# Patient Record
Sex: Female | Born: 1970 | Hispanic: No | Smoking: Never smoker
Health system: Southern US, Community
[De-identification: ages and names within clinical notes are randomized; demographics above are authoritative.]

## PROBLEM LIST (undated history)

## (undated) DIAGNOSIS — M329 Systemic lupus erythematosus, unspecified: Secondary | ICD-10-CM

## (undated) HISTORY — DX: Systemic lupus erythematosus, unspecified: M32.9

---

## 2005-12-22 DIAGNOSIS — IMO0002 Reserved for concepts with insufficient information to code with codable children: Secondary | ICD-10-CM

## 2005-12-22 DIAGNOSIS — M329 Systemic lupus erythematosus, unspecified: Secondary | ICD-10-CM

## 2005-12-22 HISTORY — DX: Systemic lupus erythematosus, unspecified: M32.9

## 2005-12-22 HISTORY — DX: Reserved for concepts with insufficient information to code with codable children: IMO0002

## 2017-01-21 DIAGNOSIS — H524 Presbyopia: Secondary | ICD-10-CM | POA: Diagnosis not present

## 2017-10-09 DIAGNOSIS — L648 Other androgenic alopecia: Secondary | ICD-10-CM | POA: Diagnosis not present

## 2017-10-29 ENCOUNTER — Encounter: Payer: Self-pay | Admitting: Internal Medicine

## 2017-10-29 ENCOUNTER — Other Ambulatory Visit
Admission: RE | Admit: 2017-10-29 | Discharge: 2017-10-29 | Disposition: A | Discharge status: Home / Self Care | Payer: 59 | Source: Ambulatory Visit | Attending: Internal Medicine | Admitting: Internal Medicine

## 2017-10-29 ENCOUNTER — Ambulatory Visit (INDEPENDENT_AMBULATORY_CARE_PROVIDER_SITE_OTHER): Payer: 59 | Admitting: Internal Medicine

## 2017-10-29 VITALS — BP 110/72 | HR 71 | Ht 61.5 in | Wt 128.0 lb

## 2017-10-29 DIAGNOSIS — Z0000 Encounter for general adult medical examination without abnormal findings: Secondary | ICD-10-CM

## 2017-10-29 DIAGNOSIS — L930 Discoid lupus erythematosus: Secondary | ICD-10-CM | POA: Insufficient documentation

## 2017-10-29 DIAGNOSIS — Z114 Encounter for screening for human immunodeficiency virus [HIV]: Secondary | ICD-10-CM

## 2017-10-29 DIAGNOSIS — R87613 High grade squamous intraepithelial lesion on cytologic smear of cervix (HGSIL): Secondary | ICD-10-CM | POA: Insufficient documentation

## 2017-10-29 LAB — CBC WITH DIFFERENTIAL/PLATELET
Basophils Absolute: 0 10*3/uL (ref 0–0.1)
Basophils Relative: 1 %
EOS ABS: 0 10*3/uL (ref 0–0.7)
Eosinophils Relative: 1 %
HCT: 36.6 % (ref 35.0–47.0)
HEMOGLOBIN: 12.3 g/dL (ref 12.0–16.0)
LYMPHS ABS: 1.8 10*3/uL (ref 1.0–3.6)
LYMPHS PCT: 47 %
MCH: 33.7 pg (ref 26.0–34.0)
MCHC: 33.7 g/dL (ref 32.0–36.0)
MCV: 100.1 fL — AB (ref 80.0–100.0)
Monocytes Absolute: 0.4 10*3/uL (ref 0.2–0.9)
Monocytes Relative: 10 %
NEUTROS PCT: 43 %
Neutro Abs: 1.6 10*3/uL (ref 1.4–6.5)
Platelets: 231 10*3/uL (ref 150–440)
RBC: 3.65 MIL/uL — AB (ref 3.80–5.20)
RDW: 12.5 % (ref 11.5–14.5)
WBC: 3.8 10*3/uL (ref 3.6–11.0)

## 2017-10-29 LAB — TSH: TSH: 1.49 u[IU]/mL (ref 0.350–4.500)

## 2017-10-29 LAB — LIPID PANEL
Cholesterol: 169 mg/dL (ref 0–200)
HDL: 70 mg/dL (ref >40)
LDL CALC: 83 mg/dL (ref 0–99)
TRIGLYCERIDES: 82 mg/dL (ref <150)
Total CHOL/HDL Ratio: 2.4 RATIO
VLDL: 16 mg/dL (ref 0–40)

## 2017-10-29 LAB — COMPREHENSIVE METABOLIC PANEL
ALK PHOS: 53 U/L (ref 38–126)
ALT: 21 U/L (ref 14–54)
AST: 27 U/L (ref 15–41)
Albumin: 4.3 g/dL (ref 3.5–5.0)
Anion gap: 8 (ref 5–15)
BUN: 11 mg/dL (ref 6–20)
CALCIUM: 9.1 mg/dL (ref 8.9–10.3)
CO2: 24 mmol/L (ref 22–32)
CREATININE: 0.51 mg/dL (ref 0.44–1.00)
Chloride: 103 mmol/L (ref 101–111)
GFR calc non Af Amer: >60 mL/min (ref >60)
Glucose, Bld: 91 mg/dL (ref 65–99)
Potassium: 4.1 mmol/L (ref 3.5–5.1)
SODIUM: 135 mmol/L (ref 135–145)
Total Bilirubin: 0.7 mg/dL (ref 0.3–1.2)
Total Protein: 8.4 g/dL — ABNORMAL HIGH (ref 6.5–8.1)

## 2017-10-29 LAB — HEMOGLOBIN A1C
HEMOGLOBIN A1C: 5.5 % (ref 4.8–5.6)
MEAN PLASMA GLUCOSE: 111.15 mg/dL

## 2017-10-29 NOTE — Progress Notes (Signed)
Date:  10/29/2017   Name:  Lauren Sampson   DOB:  [birth year]   MRN:  [id number]   Chief Complaint: Establish Care (Need PCP- Needs labs as soon as possible. HIV, A1C, CBC, CMP, Lipids. ) and Lupus (Was taking plaquenil- was taking once a day. Cut down to half a pill. unsure of mg. )  Abnormal pap about 10 years ago- menses are regular, no pelvic pain or discharge.  Subsequent pap have been normal with negative HPV - the last was 05/2016.  Lupus - diagnosed in 2007 - presented as a malar rash only.  Seen by Rheumatologist and treated with Plaquenil one a day for 8 years then tapered it off.  No recurrence of rash.  There was never any organ involvement or diffuse skin disease.  HIV screening - she requests HIV testing.  Recently in a relationship where her partner was not monogamous.  Currently not sexually active.   Review of Systems  Constitutional: Negative for chills, fatigue, fever and unexpected weight change.  HENT: Negative for trouble swallowing.   Eyes: Negative for visual disturbance.  Respiratory: Negative for chest tightness and shortness of breath.   Cardiovascular: Negative for chest pain, palpitations and leg swelling.  Gastrointestinal: Negative for abdominal pain, diarrhea and vomiting.  Genitourinary: Negative for dysuria and menstrual problem.  Musculoskeletal: Negative for arthralgias, joint swelling and myalgias.  Skin: Negative for color change and rash.  Neurological: Negative for dizziness and headaches.  Psychiatric/Behavioral: Negative for dysphoric mood and sleep disturbance.    Patient Active Problem List   Diagnosis Date Noted  . 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 Lupus erythematosus 10/29/2017    Prior to Admission medications   Medication Sig Start Date End Date Taking? Authorizing Provider  cholecalciferol (VITAMIN D) 1000 units tablet Take 1,000 Units daily by mouth.   Yes [provider]  Multiple Vitamins-Minerals  (MULTIVITAMIN WITH MINERALS) tablet Take 1 tablet daily by mouth.   Yes [provider]  Omega-3 Fatty Acids (FISH OIL) 1000 MG CAPS Take by mouth.   Yes [provider]    No Known Allergies  History reviewed. No pertinent surgical history.  Social History   Tobacco Use  . Smoking status: Never Smoker  . Smokeless tobacco: Never Used  Substance Use Topics  . Alcohol use: Yes    Alcohol/week: 1.2 - 2.4 oz    Types: 2 - 4 Cans of beer per week    Comment: Beer or WINE  . Drug use: No     Medication list has been reviewed and updated.  PHQ 2/9 Scores 10/29/2017  PHQ - 2 Score 0    Physical Exam  Constitutional: She is oriented to person, place, and time. She appears well-developed. No distress.  HENT:  Head: Normocephalic and atraumatic.  Neck: Normal range of motion. Neck supple. Carotid bruit is not present. No thyromegaly present.  Cardiovascular: Normal rate, regular rhythm and normal heart sounds.  No murmur heard. Pulmonary/Chest: Effort normal and breath sounds normal. No respiratory distress. She has no wheezes.  Musculoskeletal: Normal range of motion.  Neurological: She is alert and oriented to person, place, and time. She has normal reflexes.  Skin: Skin is warm, dry and intact. No rash noted.  Psychiatric: She has a normal mood and affect. Her speech is normal and behavior is normal. Thought content normal.  Nursing note and vitals reviewed.   BP 110/72   Pulse 71   Ht 5'  1.5" (1.562 m)   Wt 128 lb (58.1 kg)   LMP 10/07/2017   SpO2 100%   BMI 23.79 kg/m   Assessment and Plan: 1. Lupus erythematosus, unspecified form Not currently active so will not repeat immunological testing at this time - CBC with Differential/Platelet - Comprehensive metabolic panel  2. HSIL on Pap smear of cervix Repeat Pap with HPV in 6 months  3. Encounter for screening for HIV - HIV antibody  4. Routine general medical examination at a health care  facility - Lipid panel - TSH - Hemoglobin A1c   No orders of the defined types were placed in this encounter.   Partially dictated using Editor, commissioning. Any errors are unintentional.  Halina Maidens, MD [facility]  10/29/2017

## 2017-10-30 LAB — HIV ANTIBODY (ROUTINE TESTING W REFLEX): HIV Screen 4th Generation wRfx: NONREACTIVE

## 2017-12-01 DIAGNOSIS — L648 Other androgenic alopecia: Secondary | ICD-10-CM | POA: Diagnosis not present

## 2018-01-25 DIAGNOSIS — L668 Other cicatricial alopecia: Secondary | ICD-10-CM | POA: Diagnosis not present

## 2018-01-25 DIAGNOSIS — L648 Other androgenic alopecia: Secondary | ICD-10-CM | POA: Diagnosis not present

## 2018-02-05 DIAGNOSIS — H524 Presbyopia: Secondary | ICD-10-CM | POA: Diagnosis not present

## 2018-02-22 DIAGNOSIS — L668 Other cicatricial alopecia: Secondary | ICD-10-CM | POA: Diagnosis not present

## 2018-02-22 DIAGNOSIS — L648 Other androgenic alopecia: Secondary | ICD-10-CM | POA: Diagnosis not present

## 2018-05-24 ENCOUNTER — Ambulatory Visit (INDEPENDENT_AMBULATORY_CARE_PROVIDER_SITE_OTHER): Payer: 59 | Admitting: Internal Medicine

## 2018-05-24 ENCOUNTER — Encounter: Payer: Self-pay | Admitting: Internal Medicine

## 2018-05-24 VITALS — BP 110/80 | HR 63 | Temp 97.7°F | Resp 16 | Ht 62.0 in | Wt 126.0 lb

## 2018-05-24 DIAGNOSIS — Z1239 Encounter for other screening for malignant neoplasm of breast: Secondary | ICD-10-CM

## 2018-05-24 DIAGNOSIS — R87611 Atypical squamous cells cannot exclude high grade squamous intraepithelial lesion on cytologic smear of cervix (ASC-H): Secondary | ICD-10-CM | POA: Diagnosis not present

## 2018-05-24 DIAGNOSIS — Z1231 Encounter for screening mammogram for malignant neoplasm of breast: Secondary | ICD-10-CM

## 2018-05-24 DIAGNOSIS — Z0000 Encounter for general adult medical examination without abnormal findings: Secondary | ICD-10-CM | POA: Diagnosis not present

## 2018-05-24 DIAGNOSIS — R87613 High grade squamous intraepithelial lesion on cytologic smear of cervix (HGSIL): Secondary | ICD-10-CM

## 2018-05-24 LAB — POCT URINALYSIS DIPSTICK
Bilirubin, UA: NEGATIVE
GLUCOSE UA: NEGATIVE
Ketones, UA: NEGATIVE
LEUKOCYTES UA: NEGATIVE
Nitrite, UA: NEGATIVE
Protein, UA: NEGATIVE
Spec Grav, UA: <=1.005 — AB (ref 1.010–1.025)
Urobilinogen, UA: 0.2 E.U./dL
pH, UA: 7.5 (ref 5.0–8.0)

## 2018-05-24 NOTE — Patient Instructions (Signed)

## 2018-05-24 NOTE — Progress Notes (Signed)
Date:  05/24/2018   Name:  Lauren Sampson   DOB:  1971-12-02   MRN:  [id number]   Chief Complaint: Annual Exam Lauren Sampson is a 47 y.o. female who presents today for her Complete Annual Exam. She feels well. She reports exercising regularly 5-6 times per week. She reports she is sleeping well.  She is due for Pap smear - has hx of LGSIL on pap in the past. Last Pap 2017 was normal.  She is also due for a mammogram. She has no STD concerns - HIV was tested last year.  Lupus - diagnosed in 2007 - presented as a malar rash only.  Seen by Rheumatologist and treated with Plaquenil one a day for 8 years then tapered it off.  No recurrence of rash.  There was never any organ involvement or diffuse skin disease.   Review of Systems  Constitutional: Negative for chills, fatigue and fever.  HENT: Negative for congestion, hearing loss, tinnitus, trouble swallowing and voice change.   Eyes: Negative for visual disturbance.  Respiratory: Negative for cough, chest tightness, shortness of breath and wheezing.   Cardiovascular: Negative for chest pain, palpitations and leg swelling.  Gastrointestinal: Negative for abdominal pain, constipation, diarrhea and vomiting.  Endocrine: Negative for polydipsia and polyuria.  Genitourinary: Negative for dysuria, frequency, genital sores, vaginal bleeding and vaginal discharge.  Musculoskeletal: Negative for arthralgias, gait problem and joint swelling.  Skin: Negative for color change and rash.  Neurological: Negative for dizziness, tremors, light-headedness and headaches.  Hematological: Negative for adenopathy. Does not bruise/bleed easily.  Psychiatric/Behavioral: Negative for dysphoric mood and sleep disturbance. The patient is not nervous/anxious.     Patient Active Problem List   Diagnosis Date Noted  . 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 Lupus erythematosus 10/29/2017    Prior to Admission medications   Medication Sig Start  Date End Date Taking? Authorizing Provider  cholecalciferol (VITAMIN D) 1000 units tablet Take 1,000 Units daily by mouth.    [provider]  Multiple Vitamins-Minerals (MULTIVITAMIN WITH MINERALS) tablet Take 1 tablet daily by mouth.    [provider]  Omega-3 Fatty Acids (FISH OIL) 1000 MG CAPS Take by mouth.    [provider]    No Known Allergies  History reviewed. No pertinent surgical history.  Social History   Tobacco Use  . Smoking status: Never Smoker  . Smokeless tobacco: Never Used  Substance Use Topics  . Alcohol use: Yes    Alcohol/week: 1.2 - 2.4 oz    Types: 2 - 4 Cans of beer per week    Comment: Beer or WINE  . Drug use: No     Medication list has been reviewed and updated.  Current Meds  Medication Sig  . cholecalciferol (VITAMIN D) 1000 units tablet Take 1,000 Units daily by mouth.  . Multiple Vitamins-Minerals (MULTIVITAMIN WITH MINERALS) tablet Take 1 tablet daily by mouth.  . Omega-3 Fatty Acids (FISH OIL) 1000 MG CAPS Take by mouth.    PHQ 2/9 Scores 05/24/2018 10/29/2017  PHQ - 2 Score 0 0    Physical Exam  Constitutional: She is oriented to person, place, and time. She appears well-developed and well-nourished. No distress.  HENT:  Head: Normocephalic and atraumatic.  Right Ear: Tympanic membrane and ear canal normal.  Left Ear: Tympanic membrane and ear canal normal.  Nose: Right sinus exhibits no maxillary sinus tenderness. Left sinus exhibits no maxillary sinus tenderness.  Mouth/Throat: Uvula is  midline and oropharynx is clear and moist.  Eyes: Conjunctivae and EOM are normal. Right eye exhibits no discharge. Left eye exhibits no discharge. No scleral icterus.  Neck: Normal range of motion. Carotid bruit is not present. No erythema present. No thyromegaly present.  Cardiovascular: Normal rate, regular rhythm, normal heart sounds and normal pulses.  Pulmonary/Chest: Effort normal. No respiratory distress. She has  no wheezes. Right breast exhibits no mass, no nipple discharge, no skin change and no tenderness. Left breast exhibits no mass, no nipple discharge, no skin change and no tenderness.  Abdominal: Soft. Bowel sounds are normal. There is no hepatosplenomegaly. There is no tenderness. There is no CVA tenderness.  Genitourinary: Rectum normal, vagina normal and uterus normal. Pelvic exam was performed with patient supine. There is no rash, tenderness or lesion on the right labia. There is no rash, tenderness or lesion on the left labia. Cervix exhibits no motion tenderness, no discharge and no friability. Right adnexum displays no mass, no tenderness and no fullness. Left adnexum displays no mass, no tenderness and no fullness.  Musculoskeletal: Normal range of motion.  Lymphadenopathy:    She has no cervical adenopathy.    She has no axillary adenopathy.  Neurological: She is alert and oriented to person, place, and time. She has normal reflexes. No cranial nerve deficit or sensory deficit.  Skin: Skin is warm, dry and intact. No rash noted.  Psychiatric: She has a normal mood and affect. Her speech is normal and behavior is normal. Thought content normal.  Nursing note and vitals reviewed.   BP 110/80   Pulse 63   Temp 97.7 F (36.5 C) (Oral)   Resp 16   Ht 5\' 2"  (1.575 m)   Wt 126 lb (57.2 kg)   LMP 05/18/2018   SpO2 98%   BMI 23.05 kg/m   Assessment and Plan: 1. Annual physical exam Normal exam Continue healthy diet and exercise  2. HSIL on Pap smear of cervix Obtained today with normal exam  3. Breast cancer screening Schedule at [facility]

## 2018-05-27 LAB — PAP IG AND HPV HIGH-RISK
HPV, high-risk: NEGATIVE
PAP SMEAR COMMENT: 0

## 2018-05-28 ENCOUNTER — Other Ambulatory Visit: Payer: Self-pay | Admitting: Internal Medicine

## 2018-05-28 DIAGNOSIS — R87613 High grade squamous intraepithelial lesion on cytologic smear of cervix (HGSIL): Secondary | ICD-10-CM

## 2018-06-10 ENCOUNTER — Encounter (INDEPENDENT_AMBULATORY_CARE_PROVIDER_SITE_OTHER): Payer: Self-pay

## 2018-11-23 ENCOUNTER — Other Ambulatory Visit: Payer: Self-pay | Admitting: Internal Medicine

## 2018-11-23 ENCOUNTER — Telehealth: Payer: Self-pay

## 2018-11-23 DIAGNOSIS — Z1231 Encounter for screening mammogram for malignant neoplasm of breast: Secondary | ICD-10-CM

## 2018-11-23 NOTE — Telephone Encounter (Signed)
Patient scheduled.

## 2018-11-23 NOTE — Telephone Encounter (Signed)
She will have to have breast exam before I can order any tests.

## 2018-11-23 NOTE — Telephone Encounter (Signed)
Pt called stating that she has found a lump in her breast. She did have a mammo in [place]. I explained to her that she needed to schedule an appt with Dr Army Melia for a breast exam. She would like to have her mammo in [place]. Therefore, I asked that she come by tomorrow and go downstairs to sign a release of info to get her films.

## 2018-11-26 ENCOUNTER — Ambulatory Visit
Admission: RE | Admit: 2018-11-26 | Discharge: 2018-11-26 | Disposition: A | Discharge status: Home / Self Care | Payer: 59 | Source: Ambulatory Visit | Attending: Internal Medicine | Admitting: Internal Medicine

## 2018-11-26 ENCOUNTER — Ambulatory Visit
Admission: RE | Admit: 2018-11-26 | Discharge: 2018-11-26 | Disposition: A | Discharge status: Home / Self Care | Payer: 59 | Attending: Internal Medicine | Admitting: Internal Medicine

## 2018-11-26 ENCOUNTER — Encounter: Payer: Self-pay | Admitting: Internal Medicine

## 2018-11-26 ENCOUNTER — Ambulatory Visit (INDEPENDENT_AMBULATORY_CARE_PROVIDER_SITE_OTHER): Payer: 59 | Admitting: Internal Medicine

## 2018-11-26 VITALS — BP 120/60 | HR 60 | Ht 62.0 in | Wt 120.0 lb

## 2018-11-26 DIAGNOSIS — N6320 Unspecified lump in the left breast, unspecified quadrant: Secondary | ICD-10-CM | POA: Diagnosis not present

## 2018-11-26 DIAGNOSIS — R222 Localized swelling, mass and lump, trunk: Secondary | ICD-10-CM | POA: Diagnosis not present

## 2018-11-26 DIAGNOSIS — M899 Disorder of bone, unspecified: Secondary | ICD-10-CM | POA: Diagnosis not present

## 2018-11-26 NOTE — Progress Notes (Signed)
    Date:  11/26/2018   Name:  Lauren Sampson   DOB:  [birth year]   MRN:  [id number]   Chief Complaint: Breast Mass (Lump found in Left Breast. Needing mammogram ordered. Noticed lump Monday. Was lotioning body and found lump. ) The  Lump has not changed in size, it is not tender, no hx of trauma.  No previous biopsy.  Menses are about to start. She is over[place] - [place]. HPI  Review of Systems  Constitutional: Negative for chills and fatigue.  Respiratory: Negative for choking and shortness of breath.   Cardiovascular: Negative for chest pain and palpitations.    Patient Active Problem List   Diagnosis Date Noted  . 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 Lupus erythematosus 10/29/2017    No Known Allergies  History reviewed. No pertinent surgical history.  Social History   Tobacco Use  . Smoking status: Never Smoker  . Smokeless tobacco: Never Used  Substance Use Topics  . Alcohol use: Yes    Alcohol/week: 2.0 - 4.0 standard drinks    Types: 2 - 4 Cans of beer per week    Comment: Beer or WINE  . Drug use: No     Medication list has been reviewed and updated.  Current Meds  Medication Sig  . cholecalciferol (VITAMIN D) 1000 units tablet Take 1,000 Units daily by mouth.  . Multiple Vitamins-Minerals (MULTIVITAMIN WITH MINERALS) tablet Take 1 tablet daily by mouth.  . Omega-3 Fatty Acids (FISH OIL) 1000 MG CAPS Take by mouth.    PHQ 2/9 Scores 05/24/2018 10/29/2017  PHQ - 2 Score 0 0    Physical Exam  Constitutional: She is oriented to person, place, and time. She appears well-developed. No distress.  HENT:  Head: Normocephalic and atraumatic.  Cardiovascular: Normal rate, regular rhythm and normal heart sounds.  Pulmonary/Chest: Effort normal and breath sounds normal. No respiratory distress.    Musculoskeletal: Normal range of motion.  Neurological: She is alert and oriented to person, place, and time.  Skin: Skin is warm  and dry. No rash noted.  Psychiatric: She has a normal mood and affect. Her behavior is normal. Thought content normal.  Nursing note and vitals reviewed.   BP 120/60 (BP Location: Right Arm, Patient Position: Sitting, Cuff Size: Normal)   Pulse 60   Ht 5\' 2"  (1.575 m)   Wt 120 lb (54.4 kg)   SpO2 100%   BMI 21.95 kg/m   Assessment and Plan: 1. Breast mass, left - MM DIAG BREAST TOMO BILATERAL; Future - US BREAST LTD UNI LEFT INC AXILLA; Future - US BREAST LTD UNI RIGHT INC AXILLA; Future  2. Rib lesion - DG Ribs Unilateral Left; Future   Partially dictated using Editor, commissioning. Any errors are unintentional.  Halina Maidens, MD [facility]  11/26/2018

## 2018-12-24 ENCOUNTER — Ambulatory Visit
Admission: RE | Admit: 2018-12-24 | Discharge: 2018-12-24 | Disposition: A | Discharge status: Home / Self Care | Payer: 59 | Source: Ambulatory Visit | Attending: Internal Medicine | Admitting: Internal Medicine

## 2018-12-24 DIAGNOSIS — R922 Inconclusive mammogram: Secondary | ICD-10-CM | POA: Diagnosis not present

## 2018-12-24 DIAGNOSIS — N6320 Unspecified lump in the left breast, unspecified quadrant: Secondary | ICD-10-CM | POA: Insufficient documentation

## 2018-12-24 DIAGNOSIS — N6324 Unspecified lump in the left breast, lower inner quadrant: Secondary | ICD-10-CM | POA: Diagnosis not present

## 2018-12-27 ENCOUNTER — Other Ambulatory Visit: Payer: Self-pay | Admitting: Internal Medicine

## 2018-12-27 DIAGNOSIS — N6320 Unspecified lump in the left breast, unspecified quadrant: Secondary | ICD-10-CM

## 2020-05-14 ENCOUNTER — Ambulatory Visit (INDEPENDENT_AMBULATORY_CARE_PROVIDER_SITE_OTHER): Payer: 59 | Admitting: Internal Medicine

## 2020-05-14 ENCOUNTER — Other Ambulatory Visit (HOSPITAL_COMMUNITY)
Admission: RE | Admit: 2020-05-14 | Discharge: 2020-05-14 | Disposition: A | Discharge status: Home / Self Care | Payer: 59 | Source: Ambulatory Visit | Attending: Internal Medicine | Admitting: Internal Medicine

## 2020-05-14 ENCOUNTER — Other Ambulatory Visit: Payer: Self-pay

## 2020-05-14 ENCOUNTER — Encounter: Payer: Self-pay | Admitting: Internal Medicine

## 2020-05-14 VITALS — BP 106/74 | HR 63 | Temp 98.3°F | Ht 62.0 in | Wt 126.0 lb

## 2020-05-14 DIAGNOSIS — Z124 Encounter for screening for malignant neoplasm of cervix: Secondary | ICD-10-CM | POA: Insufficient documentation

## 2020-05-14 DIAGNOSIS — Z0000 Encounter for general adult medical examination without abnormal findings: Secondary | ICD-10-CM | POA: Diagnosis not present

## 2020-05-14 DIAGNOSIS — R87610 Atypical squamous cells of undetermined significance on cytologic smear of cervix (ASC-US): Secondary | ICD-10-CM | POA: Diagnosis not present

## 2020-05-14 DIAGNOSIS — R928 Other abnormal and inconclusive findings on diagnostic imaging of breast: Secondary | ICD-10-CM

## 2020-05-14 DIAGNOSIS — L930 Discoid lupus erythematosus: Secondary | ICD-10-CM

## 2020-05-14 LAB — POCT URINALYSIS DIPSTICK
Bilirubin, UA: NEGATIVE
Glucose, UA: NEGATIVE
Ketones, UA: NEGATIVE
Leukocytes, UA: NEGATIVE
Nitrite, UA: NEGATIVE
Protein, UA: NEGATIVE
Spec Grav, UA: 1.01 (ref 1.010–1.025)
Urobilinogen, UA: 0.2 E.U./dL
pH, UA: 6 (ref 5.0–8.0)

## 2020-05-14 NOTE — Progress Notes (Signed)
Date:  05/14/2020   Name:  Lauren Sampson   DOB:  [birth year]   MRN:  [id number]   Chief Complaint: Annual Exam (Breast Exam, Mammo, Pap smear.) Lauren Sampson is a 49 y.o. female who presents today for her Complete Annual Exam. She feels fairly well. She reports exercising regularly. She reports she is sleeping fairly well.   Mammogram  12/2018 - due for 6 mo f/u July 2020 never done Pap  05/2018  ASCUS HPV negative - referred to GYN but did not schedule  Immunization History  Administered Date(s) Administered  . Influenza-Unspecified 09/20/2017, 09/21/2018  . Tdap 11/30/2012  She declines the Covid vaccine and flu vaccine.  HPI  Lab Results  Component Value Date   CREATININE 0.51 10/29/2017   BUN 11 10/29/2017   NA 135 10/29/2017   K 4.1 10/29/2017   CL 103 10/29/2017   CO2 24 10/29/2017   Lab Results  Component Value Date   CHOL 169 10/29/2017   HDL 70 10/29/2017   LDLCALC 83 10/29/2017   TRIG 82 10/29/2017   CHOLHDL 2.4 10/29/2017   Lab Results  Component Value Date   TSH 1.490 10/29/2017   Lab Results  Component Value Date   HGBA1C 5.5 10/29/2017   Lab Results  Component Value Date   WBC 3.8 10/29/2017   HGB 12.3 10/29/2017   HCT 36.6 10/29/2017   MCV 100.1 (H) 10/29/2017   PLT 231 10/29/2017   Lab Results  Component Value Date   ALT 21 10/29/2017   AST 27 10/29/2017   ALKPHOS 53 10/29/2017   BILITOT 0.7 10/29/2017     Review of Systems  Constitutional: Negative for chills, fatigue and fever.  HENT: Negative for congestion, hearing loss, tinnitus, trouble swallowing and voice change.   Eyes: Negative for visual disturbance.  Respiratory: Negative for cough, chest tightness, shortness of breath and wheezing.   Cardiovascular: Negative for chest pain, palpitations and leg swelling.  Gastrointestinal: Negative for abdominal pain, constipation, diarrhea and vomiting.  Endocrine: Negative for polydipsia and polyuria.  Genitourinary:  Negative for dysuria, frequency, genital sores, menstrual problem, vaginal bleeding and vaginal discharge.  Musculoskeletal: Negative for arthralgias, gait problem and joint swelling.  Skin: Negative for color change and rash.  Neurological: Negative for dizziness, tremors, light-headedness and headaches.  Hematological: Negative for adenopathy. Does not bruise/bleed easily.  Psychiatric/Behavioral: Negative for dysphoric mood and sleep disturbance. The patient is not nervous/anxious.     Patient Active Problem List   Diagnosis Date Noted  . 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 Lupus erythematosus 10/29/2017    No Known Allergies  History reviewed. No pertinent surgical history.  Social History   Tobacco Use  . Smoking status: Never Smoker  . Smokeless tobacco: Never Used  Substance Use Topics  . Alcohol use: Yes    Alcohol/week: 2.0 - 4.0 standard drinks    Types: 2 - 4 Cans of beer per week    Comment: Beer or WINE  . Drug use: No     Medication list has been reviewed and updated.  Current Meds  Medication Sig  . cholecalciferol (VITAMIN D) 1000 units tablet Take 1,000 Units daily by mouth.  . Multiple Vitamins-Minerals (MULTIVITAMIN WITH MINERALS) tablet Take 1 tablet daily by mouth.  . Omega-3 Fatty Acids (FISH OIL) 1000 MG CAPS Take by mouth.    PHQ 2/9 Scores 05/14/2020 05/24/2018 10/29/2017  PHQ - 2 Score 0 0 0  PHQ- 9 Score  0 - -    BP Readings from Last 3 Encounters:  05/14/20 106/74  11/26/18 120/60  05/24/18 110/80    Physical Exam Vitals and nursing note reviewed.  Constitutional:      General: She is not in acute distress.    Appearance: She is well-developed.  HENT:     Head: Normocephalic and atraumatic.     Right Ear: Tympanic membrane and ear canal normal.     Left Ear: Tympanic membrane and ear canal normal.     Nose:     Right Sinus: No maxillary sinus tenderness.     Left Sinus: No maxillary sinus tenderness.  Eyes:     General: No  scleral icterus.       Right eye: No discharge.        Left eye: No discharge.     Conjunctiva/sclera: Conjunctivae normal.  Neck:     Thyroid: No thyromegaly.     Vascular: No carotid bruit.  Cardiovascular:     Rate and Rhythm: Normal rate and regular rhythm.     Pulses: Normal pulses.     Heart sounds: Normal heart sounds.  Pulmonary:     Effort: Pulmonary effort is normal. No respiratory distress.     Breath sounds: No wheezing.  Chest:     Breasts:        Right: No mass, nipple discharge, skin change or tenderness.        Left: Mass present. No nipple discharge, skin change or tenderness.       Comments: 1 cm soft mobile non tender mass Abdominal:     General: Bowel sounds are normal.     Palpations: Abdomen is soft.     Tenderness: There is no abdominal tenderness.  Genitourinary:    Labia:        Right: No tenderness, lesion or injury.        Left: No tenderness or lesion.      Vagina: Normal.     Cervix: Normal.     Uterus: Normal.      Adnexa: Right adnexa normal and left adnexa normal.     Comments: moderate white cervical mucus noted Musculoskeletal:        General: Normal range of motion.     Cervical back: Normal range of motion. No erythema.  Lymphadenopathy:     Cervical: No cervical adenopathy.  Skin:    General: Skin is warm and dry.     Findings: No rash.  Neurological:     Mental Status: She is alert and oriented to person, place, and time.     Cranial Nerves: No cranial nerve deficit.     Sensory: No sensory deficit.     Deep Tendon Reflexes: Reflexes are normal and symmetric.  Psychiatric:        Speech: Speech normal.        Behavior: Behavior normal.        Thought Content: Thought content normal.     Wt Readings from Last 3 Encounters:  05/14/20 126 lb (57.2 kg)  11/26/18 120 lb (54.4 kg)  05/24/18 126 lb (57.2 kg)    BP 106/74   Pulse 63   Temp 98.3 F (36.8 C) (Oral)   Ht 5\' 2"  (1.575 m)   Wt 126 lb (57.2 kg)   LMP 05/10/2020  (Exact Date)   SpO2 100%   BMI 23.05 kg/m   Assessment and Plan: 1. Annual physical exam Normal exam except for breast lump Continue  healthy diet and exercise - CBC with Differential/Platelet - Comprehensive metabolic panel - Lipid panel - TSH - POCT urinalysis dipstick  2. ASCUS of cervix with negative high risk HPV Pap repeated today Exam normal STI testing not indicated - Cytology - PAP  3. Abnormality of left breast on screening mammogram Left breast mass/cyst persists unchanged per patient - US BREAST LTD UNI LEFT INC AXILLA; Future - US BREAST LTD UNI RIGHT INC AXILLA; Future - MM DIAG BREAST TOMO BILATERAL; Future  4. Lupus erythematosus, unspecified form No evidence of active disease No contraindication to routine vaccinations    Partially dictated using Editor, commissioning. Any errors are unintentional.  Halina Maidens, MD [facility]  05/14/2020

## 2020-05-15 ENCOUNTER — Encounter: Payer: Self-pay | Admitting: Internal Medicine

## 2020-05-15 DIAGNOSIS — D509 Iron deficiency anemia, unspecified: Secondary | ICD-10-CM | POA: Insufficient documentation

## 2020-05-15 LAB — COMPREHENSIVE METABOLIC PANEL
ALT: 21 IU/L (ref 0–32)
AST: 23 IU/L (ref 0–40)
Albumin/Globulin Ratio: 1.3 (ref 1.2–2.2)
Albumin: 4.2 g/dL (ref 3.8–4.8)
Alkaline Phosphatase: 58 IU/L (ref 48–121)
BUN/Creatinine Ratio: 14 (ref 9–23)
BUN: 9 mg/dL (ref 6–24)
Bilirubin Total: 0.3 mg/dL (ref 0.0–1.2)
CO2: 22 mmol/L (ref 20–29)
Calcium: 8.9 mg/dL (ref 8.7–10.2)
Chloride: 102 mmol/L (ref 96–106)
Creatinine, Ser: 0.65 mg/dL (ref 0.57–1.00)
GFR calc Af Amer: 122 mL/min/{1.73_m2} (ref >59)
GFR calc non Af Amer: 106 mL/min/{1.73_m2} (ref >59)
Globulin, Total: 3.3 g/dL (ref 1.5–4.5)
Glucose: 80 mg/dL (ref 65–99)
Potassium: 4.4 mmol/L (ref 3.5–5.2)
Sodium: 138 mmol/L (ref 134–144)
Total Protein: 7.5 g/dL (ref 6.0–8.5)

## 2020-05-15 LAB — CBC WITH DIFFERENTIAL/PLATELET
Basophils Absolute: 0 10*3/uL (ref 0.0–0.2)
Basos: 0 %
EOS (ABSOLUTE): 0 10*3/uL (ref 0.0–0.4)
Eos: 1 %
Hematocrit: 28.9 % — ABNORMAL LOW (ref 34.0–46.6)
Hemoglobin: 9 g/dL — ABNORMAL LOW (ref 11.1–15.9)
Immature Grans (Abs): 0 10*3/uL (ref 0.0–0.1)
Immature Granulocytes: 0 %
Lymphocytes Absolute: 2.1 10*3/uL (ref 0.7–3.1)
Lymphs: 57 %
MCH: 27.7 pg (ref 26.6–33.0)
MCHC: 31.1 g/dL — ABNORMAL LOW (ref 31.5–35.7)
MCV: 89 fL (ref 79–97)
Monocytes Absolute: 0.4 10*3/uL (ref 0.1–0.9)
Monocytes: 11 %
Neutrophils Absolute: 1.1 10*3/uL — ABNORMAL LOW (ref 1.4–7.0)
Neutrophils: 31 %
Platelets: 371 10*3/uL (ref 150–450)
RBC: 3.25 x10E6/uL — ABNORMAL LOW (ref 3.77–5.28)
RDW: 16.3 % — ABNORMAL HIGH (ref 11.7–15.4)
WBC: 3.7 10*3/uL (ref 3.4–10.8)

## 2020-05-15 LAB — LIPID PANEL
Chol/HDL Ratio: 2.1 ratio (ref 0.0–4.4)
Cholesterol, Total: 168 mg/dL (ref 100–199)
HDL: 80 mg/dL (ref >39)
LDL Chol Calc (NIH): 78 mg/dL (ref 0–99)
Triglycerides: 46 mg/dL (ref 0–149)
VLDL Cholesterol Cal: 10 mg/dL (ref 5–40)

## 2020-05-15 LAB — TSH: TSH: 1.49 u[IU]/mL (ref 0.450–4.500)

## 2020-05-17 LAB — CYTOLOGY - PAP
Comment: NEGATIVE
Diagnosis: UNDETERMINED — AB
High risk HPV: NEGATIVE

## 2020-06-01 ENCOUNTER — Other Ambulatory Visit: Payer: 59

## 2020-10-09 ENCOUNTER — Other Ambulatory Visit: Payer: Self-pay

## 2020-10-09 ENCOUNTER — Ambulatory Visit
Admission: RE | Admit: 2020-10-09 | Discharge: 2020-10-09 | Disposition: A | Discharge status: Home / Self Care | Payer: 59 | Source: Ambulatory Visit | Attending: Internal Medicine | Admitting: Internal Medicine

## 2020-10-09 DIAGNOSIS — R928 Other abnormal and inconclusive findings on diagnostic imaging of breast: Secondary | ICD-10-CM | POA: Diagnosis not present

## 2020-10-09 DIAGNOSIS — N6324 Unspecified lump in the left breast, lower inner quadrant: Secondary | ICD-10-CM | POA: Diagnosis not present

## 2020-10-09 DIAGNOSIS — R922 Inconclusive mammogram: Secondary | ICD-10-CM | POA: Diagnosis not present

## 2020-10-10 ENCOUNTER — Other Ambulatory Visit: Payer: Self-pay | Admitting: Internal Medicine

## 2020-10-10 DIAGNOSIS — N6320 Unspecified lump in the left breast, unspecified quadrant: Secondary | ICD-10-CM

## 2020-10-10 DIAGNOSIS — R928 Other abnormal and inconclusive findings on diagnostic imaging of breast: Secondary | ICD-10-CM

## 2020-10-19 ENCOUNTER — Other Ambulatory Visit: Payer: Self-pay

## 2020-10-19 ENCOUNTER — Ambulatory Visit
Admission: RE | Admit: 2020-10-19 | Discharge: 2020-10-19 | Disposition: A | Discharge status: Home / Self Care | Payer: 59 | Source: Ambulatory Visit | Attending: Internal Medicine | Admitting: Internal Medicine

## 2020-10-19 DIAGNOSIS — N6324 Unspecified lump in the left breast, lower inner quadrant: Secondary | ICD-10-CM | POA: Diagnosis not present

## 2020-10-19 DIAGNOSIS — R928 Other abnormal and inconclusive findings on diagnostic imaging of breast: Secondary | ICD-10-CM

## 2020-10-19 DIAGNOSIS — N6320 Unspecified lump in the left breast, unspecified quadrant: Secondary | ICD-10-CM

## 2020-10-19 DIAGNOSIS — D242 Benign neoplasm of left breast: Secondary | ICD-10-CM | POA: Diagnosis not present

## 2020-10-19 HISTORY — PX: OTHER SURGICAL HISTORY: SHX169

## 2020-10-22 LAB — SURGICAL PATHOLOGY

## 2020-10-25 ENCOUNTER — Telehealth: Payer: Self-pay

## 2020-10-25 NOTE — Telephone Encounter (Signed)
I contacted the patient 10/24/2020 at 1:30pm. She stated she was "at the gym, she had my number and she would call me back".

## 2021-07-12 DIAGNOSIS — H524 Presbyopia: Secondary | ICD-10-CM | POA: Diagnosis not present

## 2021-07-18 ENCOUNTER — Encounter: Payer: Self-pay | Admitting: Internal Medicine

## 2021-07-18 ENCOUNTER — Other Ambulatory Visit: Payer: Self-pay

## 2021-07-18 ENCOUNTER — Ambulatory Visit (INDEPENDENT_AMBULATORY_CARE_PROVIDER_SITE_OTHER): Payer: 59 | Admitting: Internal Medicine

## 2021-07-18 ENCOUNTER — Other Ambulatory Visit (HOSPITAL_COMMUNITY)
Admission: RE | Admit: 2021-07-18 | Discharge: 2021-07-18 | Disposition: A | Discharge status: Home / Self Care | Payer: 59 | Source: Ambulatory Visit | Attending: Internal Medicine | Admitting: Internal Medicine

## 2021-07-18 ENCOUNTER — Other Ambulatory Visit
Admission: RE | Admit: 2021-07-18 | Discharge: 2021-07-18 | Disposition: A | Discharge status: Home / Self Care | Payer: 59 | Attending: Internal Medicine | Admitting: Internal Medicine

## 2021-07-18 VITALS — BP 122/84 | HR 81 | Temp 98.2°F | Ht 62.0 in | Wt 132.0 lb

## 2021-07-18 DIAGNOSIS — R87610 Atypical squamous cells of undetermined significance on cytologic smear of cervix (ASC-US): Secondary | ICD-10-CM

## 2021-07-18 DIAGNOSIS — Z1159 Encounter for screening for other viral diseases: Secondary | ICD-10-CM

## 2021-07-18 DIAGNOSIS — D242 Benign neoplasm of left breast: Secondary | ICD-10-CM

## 2021-07-18 DIAGNOSIS — Z0000 Encounter for general adult medical examination without abnormal findings: Secondary | ICD-10-CM | POA: Insufficient documentation

## 2021-07-18 DIAGNOSIS — D649 Anemia, unspecified: Secondary | ICD-10-CM | POA: Diagnosis not present

## 2021-07-18 DIAGNOSIS — Z1231 Encounter for screening mammogram for malignant neoplasm of breast: Secondary | ICD-10-CM | POA: Diagnosis not present

## 2021-07-18 DIAGNOSIS — Z1211 Encounter for screening for malignant neoplasm of colon: Secondary | ICD-10-CM | POA: Diagnosis not present

## 2021-07-18 LAB — IRON AND TIBC
Iron: 38 ug/dL (ref 28–170)
Saturation Ratios: 13 % (ref 10.4–31.8)
TIBC: 293 ug/dL (ref 250–450)
UIBC: 255 ug/dL

## 2021-07-18 LAB — CBC WITH DIFFERENTIAL/PLATELET
Abs Immature Granulocytes: 0.01 10*3/uL (ref 0.00–0.07)
Basophils Absolute: 0 10*3/uL (ref 0.0–0.1)
Basophils Relative: 0 %
Eosinophils Absolute: 0 10*3/uL (ref 0.0–0.5)
Eosinophils Relative: 1 %
HCT: 34.3 % — ABNORMAL LOW (ref 36.0–46.0)
Hemoglobin: 11.7 g/dL — ABNORMAL LOW (ref 12.0–15.0)
Immature Granulocytes: 0 %
Lymphocytes Relative: 43 %
Lymphs Abs: 1.8 10*3/uL (ref 0.7–4.0)
MCH: 33.4 pg (ref 26.0–34.0)
MCHC: 34.1 g/dL (ref 30.0–36.0)
MCV: 98 fL (ref 80.0–100.0)
Monocytes Absolute: 0.3 10*3/uL (ref 0.1–1.0)
Monocytes Relative: 8 %
Neutro Abs: 2 10*3/uL (ref 1.7–7.7)
Neutrophils Relative %: 48 %
Platelets: 335 10*3/uL (ref 150–400)
RBC: 3.5 MIL/uL — ABNORMAL LOW (ref 3.87–5.11)
RDW: 12.4 % (ref 11.5–15.5)
WBC: 4.2 10*3/uL (ref 4.0–10.5)
nRBC: 0 % (ref 0.0–0.2)

## 2021-07-18 LAB — LIPID PANEL
Cholesterol: 144 mg/dL (ref 0–200)
HDL: 60 mg/dL (ref >40)
LDL Cholesterol: 75 mg/dL (ref 0–99)
Total CHOL/HDL Ratio: 2.4 RATIO
Triglycerides: 45 mg/dL (ref <150)
VLDL: 9 mg/dL (ref 0–40)

## 2021-07-18 LAB — COMPREHENSIVE METABOLIC PANEL
ALT: 15 U/L (ref 0–44)
AST: 21 U/L (ref 15–41)
Albumin: 3.9 g/dL (ref 3.5–5.0)
Alkaline Phosphatase: 51 U/L (ref 38–126)
Anion gap: 7 (ref 5–15)
BUN: 8 mg/dL (ref 6–20)
CO2: 23 mmol/L (ref 22–32)
Calcium: 8.6 mg/dL — ABNORMAL LOW (ref 8.9–10.3)
Chloride: 107 mmol/L (ref 98–111)
Creatinine, Ser: 0.61 mg/dL (ref 0.44–1.00)
GFR, Estimated: >60 mL/min (ref >60)
Glucose, Bld: 94 mg/dL (ref 70–99)
Potassium: 3.3 mmol/L — ABNORMAL LOW (ref 3.5–5.1)
Sodium: 137 mmol/L (ref 135–145)
Total Bilirubin: 0.3 mg/dL (ref 0.3–1.2)
Total Protein: 8 g/dL (ref 6.5–8.1)

## 2021-07-18 LAB — HIV ANTIBODY (ROUTINE TESTING W REFLEX): HIV Screen 4th Generation wRfx: NONREACTIVE

## 2021-07-18 LAB — HEPATITIS C ANTIBODY: HCV Ab: NONREACTIVE

## 2021-07-18 LAB — TSH: TSH: 1.231 u[IU]/mL (ref 0.350–4.500)

## 2021-07-18 LAB — FERRITIN: Ferritin: 43 ng/mL (ref 11–307)

## 2021-07-18 NOTE — Progress Notes (Addendum)
Date:  07/18/2021   Name:  Lauren Sampson   DOB:  1971/11/25   MRN:  [id number]   Chief Complaint: Annual Exam (Breast Exam. Pap smear. ) Lauren Sampson is a 50 y.o. female who presents today for her Complete Annual Exam. She feels well. She reports exercising - 6 times weekly. She reports she is sleeping well. Breast complaints - none.  Mammogram: 09/2020 left breast fibroadenoma on bx DEXA: none Pap smear: 04/2020 ASCUS HPV neg Colonoscopy: none  Immunization History  Administered Date(s) Administered   Influenza-Unspecified 09/20/2017, 09/21/2018   Tdap 11/30/2012    HPI  Lab Results  Component Value Date   CREATININE 0.65 05/14/2020   BUN 9 05/14/2020   NA 138 05/14/2020   K 4.4 05/14/2020   CL 102 05/14/2020   CO2 22 05/14/2020   Lab Results  Component Value Date   CHOL 168 05/14/2020   HDL 80 05/14/2020   LDLCALC 78 05/14/2020   TRIG 46 05/14/2020   CHOLHDL 2.1 05/14/2020   Lab Results  Component Value Date   TSH 1.490 05/14/2020   Lab Results  Component Value Date   HGBA1C 5.5 10/29/2017   Lab Results  Component Value Date   WBC 3.7 05/14/2020   HGB 9.0 (L) 05/14/2020   HCT 28.9 (L) 05/14/2020   MCV 89 05/14/2020   PLT 371 05/14/2020   Lab Results  Component Value Date   ALT 21 05/14/2020   AST 23 05/14/2020   ALKPHOS 58 05/14/2020   BILITOT 0.3 05/14/2020     Review of Systems  Constitutional:  Negative for chills, fatigue and fever.  HENT:  Negative for congestion, hearing loss, tinnitus, trouble swallowing and voice change.   Eyes:  Negative for visual disturbance.  Respiratory:  Negative for cough, chest tightness, shortness of breath and wheezing.   Cardiovascular:  Negative for chest pain, palpitations and leg swelling.  Gastrointestinal:  Negative for abdominal pain, constipation, diarrhea and vomiting.  Endocrine: Negative for polydipsia and polyuria.  Genitourinary:  Negative for dysuria, frequency, genital sores,  vaginal bleeding and vaginal discharge.  Musculoskeletal:  Negative for arthralgias, gait problem and joint swelling.  Skin:  Negative for color change and rash.  Neurological:  Negative for dizziness, tremors, light-headedness and headaches.  Hematological:  Negative for adenopathy. Does not bruise/bleed easily.  Psychiatric/Behavioral:  Negative for dysphoric mood and sleep disturbance. The patient is not nervous/anxious.    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 No pertinent surgical history.  Social History   Tobacco Use   Smoking status: Never   Smokeless tobacco: Never  Vaping Use   Vaping Use: Never used  Substance Use Topics   Alcohol use: Yes    Alcohol/week: 2.0 - 4.0 standard drinks    Types: 2 - 4 Cans of beer per week    Comment: Beer or WINE   Drug use: No     Medication list has been reviewed and updated.  Current Meds  Medication Sig   cholecalciferol (VITAMIN D) 1000 units tablet Take 1,000 Units daily by mouth.   Multiple Vitamins-Minerals (MULTIVITAMIN WITH MINERALS) tablet Take 1 tablet daily by mouth.   Omega-3 Fatty Acids (FISH OIL) 1000 MG CAPS Take by mouth.    PHQ 2/9  Scores 07/18/2021 05/14/2020 05/24/2018 10/29/2017  PHQ - 2 Score 0 0 0 0  PHQ- 9 Score 0 0 - -    GAD 7 : Generalized Anxiety Score 07/18/2021 05/14/2020  Nervous, Anxious, on Edge 0 0  Control/stop worrying 0 0  Worry too much - different things 0 0  Trouble relaxing 0 0  Restless 0 0  Easily annoyed or irritable 0 0  Afraid - awful might happen 0 0  Total GAD 7 Score 0 0  Anxiety Difficulty Not difficult at all Not difficult at all    BP Readings from Last 3 Encounters:  07/18/21 122/84  05/14/20 106/74  11/26/18 120/60    Physical Exam Vitals and nursing note  reviewed.  Constitutional:      General: She is not in acute distress.    Appearance: She is well-developed.  HENT:     Head: Normocephalic and atraumatic.     Right Ear: Tympanic membrane and ear canal normal.     Left Ear: Tympanic membrane and ear canal normal.     Nose:     Right Sinus: No maxillary sinus tenderness.     Left Sinus: No maxillary sinus tenderness.  Eyes:     General: No scleral icterus.       Right eye: No discharge.        Left eye: No discharge.     Conjunctiva/sclera: Conjunctivae normal.  Neck:     Thyroid: No thyromegaly.     Vascular: No carotid bruit.  Cardiovascular:     Rate and Rhythm: Normal rate and regular rhythm.     Pulses: Normal pulses.     Heart sounds: Normal heart sounds.  Pulmonary:     Effort: Pulmonary effort is normal. No respiratory distress.     Breath sounds: No wheezing.  Chest:  Breasts:    Right: No mass, nipple discharge, skin change or tenderness.     Left: No mass, nipple discharge, skin change or tenderness.       Comments: 1 cm smooth mobile non tender mass - bx'd last year - fibroadenoma Abdominal:     General: Bowel sounds are normal.     Palpations: Abdomen is soft.     Tenderness: There is no abdominal tenderness.  Genitourinary:    Labia:        Right: No tenderness, lesion or injury.        Left: No tenderness, lesion or injury.      Vagina: Normal.     Cervix: No erythema (mild erythema at the OS).     Uterus: Normal.      Adnexa: Right adnexa normal and left adnexa normal.  Musculoskeletal:        General: Normal range of motion.     Cervical back: Normal range of motion. No erythema.     Right lower leg: No edema.     Left lower leg: No edema.  Lymphadenopathy:     Cervical: No cervical adenopathy.  Skin:    General: Skin is warm and dry.     Findings: No rash.  Neurological:     Mental Status: She is alert and oriented to person, place, and time.     Cranial Nerves: No cranial nerve deficit.      Sensory: No sensory deficit.     Deep Tendon Reflexes: Reflexes are normal and symmetric.  Psychiatric:        Attention and Perception: Attention normal.  Mood and Affect: Mood normal.    Wt Readings from Last 3 Encounters:  07/18/21 132 lb (59.9 kg)  05/14/20 126 lb (57.2 kg)  11/26/18 120 lb (54.4 kg)    BP 122/84 (BP Location: Right Arm, Patient Position: Sitting, Cuff Size: Normal)   Pulse 81   Temp 98.2 F (36.8 C) (Oral)   Ht '5\' 2"'$  (1.575 m)   Wt 132 lb (59.9 kg)   SpO2 100%   BMI 24.14 kg/m   Assessment and Plan: 1. Annual physical exam Normal exam Continue healthy diet, exercise - CBC with Differential/Platelet - Comprehensive metabolic panel - Lipid panel - TSH - HIV Antibody (routine testing w rflx)  2. Encounter for screening mammogram for breast cancer Bx negative last year; may need diagnostic rather than screening - MM 3D SCREEN BREAST BILATERAL; Future  3. Colon cancer screening Pt will check with insurance to see if colonoscopy is covered  4. Atypical squamous cell changes of undetermined significance (ASCUS) on cervical cytology with negative high risk human papilloma virus (HPV) test result Pap repeated today. 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  She declines GYN referral at this time. - Cytology - PAP  5. Fibroadenoma of left breast Biopsied last year.  Fibroma still palpable but unchanged. Patient does not desire surgical excision.  6. Need for hepatitis C screening test - Hepatitis C antibody  7. Anemia, unspecified type Continue iron supplement. Likely due to vegetarian diet. - Iron, TIBC and Ferritin Panel   Partially dictated using Editor, commissioning. Any errors are unintentional.  Halina Maidens, MD [facility]  07/18/2021

## 2021-07-18 NOTE — Patient Instructions (Signed)
Call your insurance to see if it covers a Screening Colonoscopy.

## 2021-07-22 LAB — CYTOLOGY - PAP
Chlamydia: NEGATIVE
Comment: NEGATIVE
Comment: NEGATIVE
Comment: NORMAL
Diagnosis: HIGH — AB
High risk HPV: NEGATIVE
Neisseria Gonorrhea: NEGATIVE

## 2021-11-05 ENCOUNTER — Other Ambulatory Visit: Payer: Self-pay

## 2021-11-05 ENCOUNTER — Telehealth: Payer: Self-pay

## 2021-11-05 DIAGNOSIS — D242 Benign neoplasm of left breast: Secondary | ICD-10-CM

## 2021-11-05 NOTE — Telephone Encounter (Signed)
Copied from [employer] [id number]. Topic: Referral - Request for Referral >> Nov 05, 2021  9:52 AM Celene Kras wrote: Has patient seen PCP for this complaint? Yes.   *If NO, is insurance requiring patient see PCP for this issue before PCP can refer them? Referral for which specialty: Mammography Preferred provider/office: [facility] Reason for referral: Pt called and is requesting to have diagnostic mammogram and ultrasound. Please advise.

## 2021-11-05 NOTE — Telephone Encounter (Signed)
Ordered both diagnostic mammo and [place] for left breast. Follow up from last Mammo.

## 2021-11-05 NOTE — Telephone Encounter (Signed)
Called and left VM asking patient why she needs a diagnostic mammo and [place]. Told her we cannot put in a order for this without a diagnosis.

## 2021-11-05 NOTE — Telephone Encounter (Signed)
Copied from [place] [id number]. Topic: Referral - Request for Referral >> Nov 05, 2021  9:52 AM Celene Kras wrote: Has patient seen PCP for this complaint? Yes.   *If NO, is insurance requiring patient see PCP for this issue before PCP can refer them? Referral for which specialty: Mammography Preferred provider/office: [facility] Reason for referral: Pt called and is requesting to have diagnostic mammogram and ultrasound. Please advise. >> Nov 05, 2021 12:01 PM Yvette Rack wrote: Pt stated when she called to schedule her mammogram she was advised that an order had to be placed. Pt also stated that she was advised that the ultrasound was a follow up to the mammogram.

## 2021-11-06 NOTE — Telephone Encounter (Signed)
Diagnostic mammo and [place] was ordered for patient for follow up from last mammo.

## 2021-11-26 ENCOUNTER — Other Ambulatory Visit: Payer: 59

## 2021-12-13 ENCOUNTER — Telehealth: Payer: Self-pay

## 2021-12-13 ENCOUNTER — Other Ambulatory Visit: Payer: Self-pay

## 2021-12-13 DIAGNOSIS — R87613 High grade squamous intraepithelial lesion on cytologic smear of cervix (HGSIL): Secondary | ICD-10-CM

## 2021-12-13 NOTE — Telephone Encounter (Signed)
Called pt expressed to her that she needed to see GYN for her abnormal pap. Pt verbalized understanding and did agree to see a GYN. A referral was placed.  KP

## 2022-01-03 ENCOUNTER — Other Ambulatory Visit: Payer: Self-pay

## 2022-01-03 ENCOUNTER — Ambulatory Visit
Admission: RE | Admit: 2022-01-03 | Discharge: 2022-01-03 | Disposition: A | Discharge status: Home / Self Care | Payer: No Typology Code available for payment source | Source: Ambulatory Visit | Attending: Internal Medicine | Admitting: Internal Medicine

## 2022-01-03 DIAGNOSIS — D242 Benign neoplasm of left breast: Secondary | ICD-10-CM | POA: Insufficient documentation

## 2022-01-06 ENCOUNTER — Ambulatory Visit: Payer: 59 | Admitting: Obstetrics and Gynecology

## 2022-01-17 ENCOUNTER — Other Ambulatory Visit: Payer: Self-pay

## 2022-01-17 ENCOUNTER — Other Ambulatory Visit (HOSPITAL_COMMUNITY)
Admission: RE | Admit: 2022-01-17 | Discharge: 2022-01-17 | Disposition: A | Discharge status: Home / Self Care | Payer: No Typology Code available for payment source | Source: Ambulatory Visit | Attending: Obstetrics and Gynecology | Admitting: Obstetrics and Gynecology

## 2022-01-17 ENCOUNTER — Ambulatory Visit (INDEPENDENT_AMBULATORY_CARE_PROVIDER_SITE_OTHER): Payer: No Typology Code available for payment source | Admitting: Obstetrics and Gynecology

## 2022-01-17 ENCOUNTER — Encounter: Payer: Self-pay | Admitting: Obstetrics and Gynecology

## 2022-01-17 VITALS — BP 112/70 | Ht 61.0 in | Wt 137.0 lb

## 2022-01-17 DIAGNOSIS — R87613 High grade squamous intraepithelial lesion on cytologic smear of cervix (HGSIL): Secondary | ICD-10-CM

## 2022-01-17 NOTE — Progress Notes (Signed)
° °  GYNECOLOGY CLINIC COLPOSCOPY PROCEDURE NOTE  51 y.o. G1P1001 here for colposcopy for high-grade squamous intraepithelial neoplasia  (HGSIL-encompassing moderate and severe dysplasia)  pap smear on 07/18/2021. Discussed underlying role for HPV infection in the development of cervical dysplasia, its natural history and progression/regression, need for surveillance.  Is the patient  pregnant: No LMP: Patient's last menstrual period was 12/29/2021. Smoking status:  reports that she has never smoked. She has never used smokeless tobacco.  Patient given informed consent, signed copy in the chart, time out was performed.  The patient was position in dorsal lithotomy position. Speculum was placed the cervix was visualized.   After application of acetic acid colposcopic inspection of the cervix was undertaken.   Colposcopy adequate, full visualization of transformation zone: Yes abnormal vessels noted at 12, 1, and 6 o'clock; corresponding biopsies obtained.  Polypoid tissue at 6 o'clock. ECC specimen obtained:  Yes  All specimens were labeled and sent to pathology.   Patient was given post procedure instructions.  Will follow up pathology and manage accordingly.  Routine preventative health maintenance measures emphasized.  Physical Exam Genitourinary:       Adrian Prows MD [facility] 01/17/2022 8:49 AM

## 2022-01-17 NOTE — Patient Instructions (Signed)
Colposcopy, Care After The following information offers guidance on how to care for yourself after your procedure. Your doctor may also give you more specific instructions. If you have problems or questions, contact your doctor. What can I expect after the procedure? If you did not have a sample of your tissue taken out (did not have a biopsy), you may only have some spotting of blood for a few days. You can go back to your normal activities. If you had a sample of your tissue taken out, it is common to have: Soreness and mild pain. These may last for a few days. Mild bleeding or fluid (discharge) coming from your vagina. The fluid will look dark and grainy. You may have this for a few days. The fluid may be caused by a liquid that was used during your procedure. You may need to wear a sanitary pad. Spotting of blood for at least 48 hours after the procedure. Follow these instructions at home: Medicines Take over-the-counter and prescription medicines only as told by your doctor. Ask your doctor what over-the-counter pain medicines and prescription medicines you can start taking again. This is very important if you take blood thinners. Activity For at least 3 days, or for as long as told by your doctor, avoid: Douching. Using tampons. Having sex. Return to your normal activities as told by your doctor. Ask your doctor what activities are safe for you. General instructions Ask your doctor if you may take baths, swim, or use a hot tub. You may take showers. If you use birth control (contraception), keep using it. Keep all follow-up visits. Contact a doctor if: You have a fever or chills. You faint or feel light-headed. Get help right away if: You bleed a lot from your vagina. A lot of bleeding means that the bleeding soaks through a pad in less than 1 hour. You have clumps of blood (blood clots) coming from your vagina. You have signs that could mean you have an infection. This may be fluid  coming from your vagina that is: Different than normal. Yellow. Bad-smelling. You have very bad pain or cramps in your lower belly that do not get better with medicine. Summary If you did not have a sample of your tissue taken out, you may only have some spotting of blood for a few days. You can go back to your normal activities. If you had a sample of your tissue taken out, it is common to have mild pain for a few days and spotting for 48 hours. Avoid douching, using tampons, and having sex for at least 3 days after the procedure or for as long as told. Get help right away if you have a lot of bleeding, very bad pain, or signs of infection. This information is not intended to replace advice given to you by your health care provider. Make sure you discuss any questions you have with your health care provider. Document Revised: 05/05/2021 Document Reviewed: 05/05/2021 Elsevier Patient Education  2022 Elsevier Inc.  

## 2022-01-20 LAB — SURGICAL PATHOLOGY

## 2022-03-19 ENCOUNTER — Telehealth: Payer: Self-pay

## 2022-03-19 ENCOUNTER — Other Ambulatory Visit: Payer: Self-pay

## 2022-03-19 DIAGNOSIS — Z1211 Encounter for screening for malignant neoplasm of colon: Secondary | ICD-10-CM

## 2022-03-19 NOTE — Telephone Encounter (Signed)
Copied from [place] [id number]. Topic: Referral - Request for Referral ?>> Mar 19, 2022  1:35 PM Pawlus, Brayton Layman A wrote: ?Reason for CRM: Pt called in requesting a referral be sent in to get a Colonoscopy, please advise. ?

## 2022-03-19 NOTE — Telephone Encounter (Signed)
Sent Referral to [facility] in [place]. ?

## 2022-03-19 NOTE — Telephone Encounter (Signed)
Copied from [place] [id number]. Topic: General - Other ?>> Mar 19, 2022  4:20 PM Tessa Lerner A wrote: ?Reason for CRM: The patient has spoken with their insurance provider and been notified that their colonoscopy would be covered ? ?The patient would like to be referred to a facility in [place], [place] for the procedure  ? ?Please contact further if needed ?

## 2022-03-19 NOTE — Telephone Encounter (Signed)
Called pt left VM to call back.  ? ?Did pt contact insurance to see if colonoscopy is covered? ? ?[facility] nurse may give results to patient if they return call to clinic, a CRM has been created. ? ?KP ?

## 2022-03-19 NOTE — Telephone Encounter (Signed)
See above message  ? ?KP

## 2022-03-21 ENCOUNTER — Telehealth: Payer: Self-pay

## 2022-03-21 NOTE — Telephone Encounter (Signed)
CALLED PATIENT NO ANSWER LEFT VOICEMAIL FOR A CALL BACK ? ?

## 2022-03-25 ENCOUNTER — Other Ambulatory Visit: Payer: Self-pay

## 2022-03-25 ENCOUNTER — Telehealth: Payer: Self-pay

## 2022-03-25 DIAGNOSIS — Z1211 Encounter for screening for malignant neoplasm of colon: Secondary | ICD-10-CM

## 2022-03-25 MED ORDER — NA SULFATE-K SULFATE-MG SULF 17.5-3.13-1.6 GM/177ML PO SOLN: 1.0000 | Freq: Once | ORAL | 0 refills | Status: AC

## 2022-03-25 NOTE — Telephone Encounter (Signed)
CALLED PATIENT NO ANSWER LEFT VOICEMAIL FOR A CALL BACK ? ?

## 2022-03-25 NOTE — Progress Notes (Signed)
Gastroenterology Pre-Procedure Review ? ?Request Date: 04/10/2022 ?Requesting Physician: Dr. Marius Ditch ? ?PATIENT REVIEW QUESTIONS: The patient responded to the following health history questions as indicated:   ? ?1. Are you having any GI issues? no ?2. Do you have a personal history of Polyps? no ?3. Do you have a family history of Colon Cancer or Polyps? no ?4. Diabetes Mellitus? no ?5. Joint replacements in the past 12 months?no ?6. Major health problems in the past 3 months?no ?7. Any artificial heart valves, MVP, or defibrillator?no ?   ?MEDICATIONS & ALLERGIES:    ?Patient reports the following regarding taking any anticoagulation/antiplatelet therapy:   ?Plavix, Coumadin, Eliquis, Xarelto, Lovenox, Pradaxa, Brilinta, or Effient? no ?Aspirin? no ? ?Patient confirms/reports the following medications:  ?Current Outpatient Medications  ?Medication Sig Dispense Refill  ? cholecalciferol (VITAMIN D) 1000 units tablet Take 1,000 Units daily by mouth.    ? Multiple Vitamins-Minerals (MULTIVITAMIN WITH MINERALS) tablet Take 1 tablet daily by mouth.    ? Omega-3 Fatty Acids (FISH OIL) 1000 MG CAPS Take by mouth.    ? ?No current facility-administered medications for this visit.  ? ? ?Patient confirms/reports the following allergies:  ?No Known Allergies ? ?No orders of the defined types were placed in this encounter. ? ? ?AUTHORIZATION INFORMATION ?Primary Insurance: ?1D#: ?Group #: ? ?Secondary Insurance: ?1D#: ?Group #: ? ?SCHEDULE INFORMATION: ?Date: 04/10/2022 ?Time: ?Location:[facility] ? ?

## 2022-03-27 NOTE — Telephone Encounter (Signed)
Noted. Obtaining supplies. ?

## 2022-03-31 ENCOUNTER — Encounter: Payer: Self-pay | Admitting: Gastroenterology

## 2022-04-10 ENCOUNTER — Other Ambulatory Visit: Payer: Self-pay

## 2022-04-10 ENCOUNTER — Encounter
Admission: RE | Disposition: A | Discharge status: Home / Self Care | Payer: Self-pay | Source: Home / Self Care | Attending: Gastroenterology

## 2022-04-10 ENCOUNTER — Encounter: Payer: Self-pay | Admitting: Gastroenterology

## 2022-04-10 ENCOUNTER — Ambulatory Visit: Payer: No Typology Code available for payment source | Admitting: Anesthesiology

## 2022-04-10 ENCOUNTER — Ambulatory Visit
Admission: RE | Admit: 2022-04-10 | Discharge: 2022-04-10 | Disposition: A | Discharge status: Home / Self Care | Payer: No Typology Code available for payment source | Attending: Gastroenterology | Admitting: Gastroenterology

## 2022-04-10 DIAGNOSIS — Z1211 Encounter for screening for malignant neoplasm of colon: Secondary | ICD-10-CM | POA: Diagnosis present

## 2022-04-10 DIAGNOSIS — Z8739 Personal history of other diseases of the musculoskeletal system and connective tissue: Secondary | ICD-10-CM | POA: Diagnosis not present

## 2022-04-10 HISTORY — PX: COLONOSCOPY WITH PROPOFOL: SHX5780

## 2022-04-10 SURGERY — COLONOSCOPY WITH PROPOFOL
Anesthesia: General | Site: Rectum

## 2022-04-10 MED ORDER — STERILE WATER FOR IRRIGATION IR SOLN
Status: DC | PRN
  Administered 2022-04-10: 500 mL

## 2022-04-10 MED ORDER — ACETAMINOPHEN 160 MG/5ML PO SOLN: 325.0000 mg | ORAL | Status: DC | PRN

## 2022-04-10 MED ORDER — ONDANSETRON HCL 4 MG/2ML IJ SOLN: 4.0000 mg | Freq: Once | INTRAMUSCULAR | Status: DC | PRN

## 2022-04-10 MED ORDER — PROPOFOL 10 MG/ML IV BOLUS
INTRAVENOUS | Status: DC | PRN
  Administered 2022-04-10: 50 mg via INTRAVENOUS
  Administered 2022-04-10: 40 mg via INTRAVENOUS
  Administered 2022-04-10: 150 mg via INTRAVENOUS
  Administered 2022-04-10 (×3): 40 mg via INTRAVENOUS

## 2022-04-10 MED ORDER — LACTATED RINGERS IV SOLN: INTRAVENOUS | Status: DC

## 2022-04-10 MED ORDER — LIDOCAINE HCL (CARDIAC) PF 100 MG/5ML IV SOSY
PREFILLED_SYRINGE | INTRAVENOUS | Status: DC | PRN
Start: 2022-04-10 — End: 2022-04-10
  Administered 2022-04-10: 50 mg via INTRAVENOUS

## 2022-04-10 MED ORDER — ACETAMINOPHEN 325 MG PO TABS: 325.0000 mg | ORAL_TABLET | ORAL | Status: DC | PRN

## 2022-04-10 MED ORDER — SODIUM CHLORIDE 0.9 % IV SOLN: INTRAVENOUS | Status: DC

## 2022-04-10 SURGICAL SUPPLY — 6 items
GOWN CVR UNV OPN BCK APRN NK (MISCELLANEOUS) ×2 IMPLANT
GOWN ISOL THUMB LOOP REG UNIV (MISCELLANEOUS) ×4
KIT PRC NS LF DISP ENDO (KITS) ×1 IMPLANT
KIT PROCEDURE OLYMPUS (KITS) ×2
MANIFOLD NEPTUNE II (INSTRUMENTS) ×2 IMPLANT
WATER STERILE IRR 250ML POUR (IV SOLUTION) ×2 IMPLANT

## 2022-04-10 NOTE — H&P (Signed)
?Cephas Darby, MD ?[address]  ?[address]  ?[place], [place] [address]  ?Main: [phone]  ?Fax: [phone] ?Pager: [phone] ? ?Primary Care Physician:  Glean Hess, MD ?Primary Gastroenterologist:  Dr. Cephas Darby ? ?Pre-Procedure History & Physical: ?HPI:  Lauren Sampson is a 51 y.o. female is here for an colonoscopy. ?  ?Past Medical History:  ?Diagnosis Date  ? Lupus ([place]) 2007  ? ? ?Past Surgical History:  ?Procedure Laterality Date  ? breast biopsy Left 10/19/2020  ? FIBROEPITHELIAL LESION CONSISTENT WITH FIBROADENOMA, x clip  ? ? ?Prior to Admission medications   ?Medication Sig Start Date End Date Taking? Authorizing Provider  ?cholecalciferol (VITAMIN D) 1000 units tablet Take 1,000 Units daily by mouth.   Yes [provider]  ?Multiple Vitamins-Minerals (MULTIVITAMIN WITH MINERALS) tablet Take 1 tablet daily by mouth.   Yes [provider]  ?Omega-3 Fatty Acids (FISH OIL) 1000 MG CAPS Take by mouth.   Yes [provider]  ? ? ?Allergies as of 03/25/2022  ? (No Known Allergies)  ? ? ?Family History  ?Problem Relation Age of Onset  ? Pancreatic cancer Mother   ? Alcohol abuse Mother   ? Liver cancer Father   ? Alcohol abuse Father   ? Cancer Maternal Aunt   ? Diabetes Maternal Uncle   ? Lupus Brother   ? ? ?Social History  ? ?Socioeconomic History  ? Marital status: Divorced  ?  Spouse name: Not on file  ? Number of children: 1  ? Years of education: Not on file  ? Highest education level: Not on file  ?Occupational History  ? Occupation: Advertising account planner  ?Tobacco Use  ? Smoking status: Never  ? Smokeless tobacco: Never  ?Vaping Use  ? Vaping Use: Never used  ?Substance and Sexual Activity  ? Alcohol use: Yes  ?  Alcohol/week: 2.0 - 4.0 standard drinks  ?  Types: 2 - 4 Cans of beer per week  ?  Comment: Beer or WINE  ? Drug use: No  ? Sexual activity: Never  ?  Partners: Male  ?Other Topics Concern  ? Not on file  ?Social History Narrative  ? Not on  file  ? ?Social Determinants of Health  ? ?Financial Resource Strain: Not on file  ?Food Insecurity: Not on file  ?Transportation Needs: Not on file  ?Physical Activity: Not on file  ?Stress: Not on file  ?Social Connections: Not on file  ?Intimate Partner Violence: Not on file  ? ? ?Review of Systems: ?See HPI, otherwise negative ROS ? ?Physical Exam: ?BP (!) 126/53   Pulse 62   Temp 97.7 ?F (36.5 ?C) (Temporal)   Resp 20   Ht '5\' 1"'$  (1.549 m)   Wt 59.4 kg   SpO2 100%   BMI 24.75 kg/m?  ?General:   Alert,  pleasant and cooperative in NAD ?Head:  Normocephalic and atraumatic. ?Neck:  Supple; no masses or thyromegaly. ?Lungs:  Clear throughout to auscultation.    ?Heart:  Regular rate and rhythm. ?Abdomen:  Soft, nontender and nondistended. Normal bowel sounds, without guarding, and without rebound.   ?Neurologic:  Alert and  oriented x4;  grossly normal neurologically. ? ?Impression/Plan: ?Lauren Sampson is here for an colonoscopy to be performed for colon cancre screening ? ?Risks, benefits, limitations, and alternatives regarding  colonoscopy have been reviewed with the patient.  Questions have been answered.  All parties agreeable. ? ? ?Sherri Sear, MD  04/10/2022, 8:02  AM ?

## 2022-04-10 NOTE — Anesthesia Postprocedure Evaluation (Signed)
Anesthesia Post Note ? ?Patient: Lauren Sampson ? ?Procedure(s) Performed: COLONOSCOPY WITH PROPOFOL (Rectum) ? ? ?  ?Patient location during evaluation: PACU ?Anesthesia Type: General ?Level of consciousness: awake ?Pain management: pain level controlled ?Vital Signs Assessment: post-procedure vital signs reviewed and stable ?Respiratory status: respiratory function stable ?Cardiovascular status: stable ?Postop Assessment: no signs of nausea or vomiting ?Anesthetic complications: no ? ? ?No notable events documented. ? ?Veda Canning ? ? ? ? ? ?

## 2022-04-10 NOTE — Op Note (Signed)
[facility] ?Gastroenterology ?Patient Name: Lauren Sampson ?Procedure Date: 04/10/2022 7:16 AM ?MRN: [id number] ?Account #: [id number] ?Date of Birth: 08-10-1971 ?Admit Type: Outpatient ?Age: 51 ?Room: [facility] OR ROOM 01 ?Gender: Female ?Note Status: Finalized ?Instrument Name: Peds [id number] ?Procedure:             Colonoscopy ?Indications:           Screening for colorectal malignant neoplasm, This is  ?                       the patient's first colonoscopy ?Providers:             Lin Landsman MD, MD ?Referring MD:          Halina Maidens, MD (Referring MD) ?Medicines:             General Anesthesia ?Complications:         No immediate complications. Estimated blood loss: None. ?Procedure:             Pre-Anesthesia Assessment: ?                       - Prior to the procedure, a History and Physical was  ?                       performed, and patient medications and allergies were  ?                       reviewed. The patient is competent. The risks and  ?                       benefits of the procedure and the sedation options and  ?                       risks were discussed with the patient. All questions  ?                       were answered and informed consent was obtained.  ?                       Patient identification and proposed procedure were  ?                       verified by the physician, the nurse, the  ?                       anesthesiologist, the anesthetist and the technician  ?                       in the pre-procedure area in the procedure room in the  ?                       endoscopy suite. Mental Status Examination: alert and  ?                       oriented. Airway Examination: normal oropharyngeal  ?                       airway and neck mobility. Respiratory Examination:  ?  clear to auscultation. CV Examination: normal.  ?                       Prophylactic Antibiotics: The patient does not require  ?                       prophylactic  antibiotics. Prior Anticoagulants: The  ?                       patient has taken no previous anticoagulant or  ?                       antiplatelet agents. ASA Grade Assessment: I - A  ?                       normal, healthy patient. After reviewing the risks and  ?                       benefits, the patient was deemed in satisfactory  ?                       condition to undergo the procedure. The anesthesia  ?                       plan was to use general anesthesia. Immediately prior  ?                       to administration of medications, the patient was  ?                       re-assessed for adequacy to receive sedatives. The  ?                       heart rate, respiratory rate, oxygen saturations,  ?                       blood pressure, adequacy of pulmonary ventilation, and  ?                       response to care were monitored throughout the  ?                       procedure. The physical status of the patient was  ?                       re-assessed after the procedure. ?                       After obtaining informed consent, the colonoscope was  ?                       passed under direct vision. Throughout the procedure,  ?                       the patient's blood pressure, pulse, and oxygen  ?                       saturations were monitored continuously. The was  ?  introduced through the anus and advanced to the the  ?                       terminal ileum, with identification of the appendiceal  ?                       orifice and IC valve. The colonoscopy was performed  ?                       without difficulty. The patient tolerated the  ?                       procedure well. The quality of the bowel preparation  ?                       was evaluated using the BBPS [facility] Bowel Preparation  ?                       Scale) with scores of: Right Colon = 3, Transverse  ?                       Colon = 3 and Left Colon = 3 (entire mucosa seen well  ?                        with no residual staining, small fragments of stool or  ?                       opaque liquid). The total BBPS score equals 9. ?Findings: ?     The perianal and digital rectal examinations were normal. Pertinent  ?     negatives include normal sphincter tone and no palpable rectal lesions. ?     The terminal ileum appeared normal. ?     The entire examined colon appeared normal. ?     The retroflexed view of the distal rectum and anal verge was normal and  ?     showed no anal or rectal abnormalities. ?Impression:            - The examined portion of the ileum was normal. ?                       - The entire examined colon is normal. ?                       - The distal rectum and anal verge are normal on  ?                       retroflexion view. ?                       - No specimens collected. ?Recommendation:        - Discharge patient to home (with escort). ?                       - Resume regular diet today. ?                       - Continue present medications. ?                       -  Repeat colonoscopy in 10 years for screening  ?                       purposes. ?Procedure Code(s):     --- Professional --- ?                       W6568, Colorectal cancer screening; colonoscopy on  ?                       individual not meeting criteria for high risk ?Diagnosis Code(s):     --- Professional --- ?                       Z12.11, Encounter for screening for malignant neoplasm  ?                       of colon ?CPT copyright 2019 American Medical Association. All rights reserved. ?The codes documented in this report are preliminary and upon coder review may  ?be revised to meet current compliance requirements. ?Dr. Ulyess Mort ?Ruchel Brandenburger Raeanne Gathers MD, MD ?04/10/2022 8:37:43 AM ?This report has been signed electronically. ?Number of Addenda: 0 ?Note Initiated On: 04/10/2022 7:16 AM ?Scope Withdrawal Time: 0 hours 9 minutes 17 seconds  ?Total Procedure Duration: 0 hours 13 minutes 3 seconds  ?Estimated Blood  Loss:  Estimated blood loss: none. ?     [facility] ?

## 2022-04-10 NOTE — Transfer of Care (Signed)
Immediate Anesthesia Transfer of Care Note ? ?Patient: Lauren Sampson ? ?Procedure(s) Performed: COLONOSCOPY WITH PROPOFOL (Rectum) ? ?Patient Location: PACU ? ?Anesthesia Type: General ? ?Level of Consciousness: awake, alert  and patient cooperative ? ?Airway and Oxygen Therapy: Patient Spontanous Breathing and Patient connected to supplemental oxygen ? ?Post-op Assessment: Post-op Vital signs reviewed, Patient's Cardiovascular Status Stable, Respiratory Function Stable, Patent Airway and No signs of Nausea or vomiting ? ?Post-op Vital Signs: Reviewed and stable ? ?Complications: No notable events documented. ? ?

## 2022-04-10 NOTE — Anesthesia Procedure Notes (Signed)
Date/Time: 04/10/2022 8:15 AM ?Performed by: Cameron Ali, CRNA ?Pre-anesthesia Checklist: Patient identified, Emergency Drugs available, Suction available, Timeout performed and Patient being monitored ?Patient Re-evaluated:Patient Re-evaluated prior to induction ?Oxygen Delivery Method: Nasal cannula ?Placement Confirmation: positive ETCO2 ? ? ? ? ?

## 2022-04-10 NOTE — Anesthesia Preprocedure Evaluation (Addendum)
Anesthesia Evaluation  ?Patient identified by MRN, date of birth, ID band ?Patient awake ? ? ? ?Reviewed: ?Allergy & Precautions, NPO status  ? ?Airway ?Mallampati: II ? ?TM Distance: >3 FB ? ? ? ? Dental ?  ?Pulmonary ? ?  ?Pulmonary exam normal ? ? ? ? ? ? ? Cardiovascular ?negative cardio ROS ? ? ?Rhythm:Regular Rate:Normal ? ? ?  ?Neuro/Psych ?  ? GI/Hepatic ?  ?Endo/Other  ? ? Renal/GU ?  ? ?  ?Musculoskeletal ? ? Abdominal ?  ?Peds ? Hematology ?  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 ? Reproductive/Obstetrics ? ?  ? ? ? ? ? ? ? ? ? ? ? ? ? ?  ?  ? ? ? ? ? ? ? ?Anesthesia Physical ?Anesthesia Plan ? ?ASA: 1 ? ?Anesthesia Plan: General  ? ?Post-op Pain Management:   ? ?Induction: Intravenous ? ?PONV Risk Score and Plan: Propofol infusion, TIVA and Treatment may vary due to age or medical condition ? ?Airway Management Planned: Natural Airway and Nasal Cannula ? ?Additional Equipment:  ? ?Intra-op Plan:  ? ?Post-operative Plan:  ? ?Informed Consent: I have reviewed the patients History and Physical, chart, labs and discussed the procedure including the risks, benefits and alternatives for the proposed anesthesia with the patient or authorized representative who has indicated his/her understanding and acceptance.  ? ? ? ? ? ?Plan Discussed with: CRNA ? ?Anesthesia Plan Comments: (Patient refused pregnancy test.)  ? ? ? ? ?Anesthesia Quick Evaluation ? ?

## 2022-04-14 ENCOUNTER — Encounter: Payer: Self-pay | Admitting: Gastroenterology

## 2022-04-18 ENCOUNTER — Ambulatory Visit: Payer: No Typology Code available for payment source | Admitting: Obstetrics and Gynecology

## 2022-04-21 HISTORY — PX: CERVICAL BIOPSY  W/ LOOP ELECTRODE EXCISION: SUR135

## 2022-04-23 ENCOUNTER — Other Ambulatory Visit (HOSPITAL_COMMUNITY)
Admission: RE | Admit: 2022-04-23 | Discharge: 2022-04-23 | Disposition: A | Discharge status: Home / Self Care | Payer: No Typology Code available for payment source | Source: Ambulatory Visit | Attending: Obstetrics and Gynecology | Admitting: Obstetrics and Gynecology

## 2022-04-23 ENCOUNTER — Ambulatory Visit (INDEPENDENT_AMBULATORY_CARE_PROVIDER_SITE_OTHER): Payer: No Typology Code available for payment source | Admitting: Obstetrics and Gynecology

## 2022-04-23 ENCOUNTER — Encounter: Payer: Self-pay | Admitting: Obstetrics and Gynecology

## 2022-04-23 VITALS — BP 122/70 | Ht 61.0 in | Wt 134.0 lb

## 2022-04-23 DIAGNOSIS — D069 Carcinoma in situ of cervix, unspecified: Secondary | ICD-10-CM | POA: Insufficient documentation

## 2022-04-23 NOTE — Progress Notes (Signed)
Patient identified, informed consent obtained, signed copy in chart, time out performed.  Pap smear and colposcopy reviewed.   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  Cervix visualized. ?Paracervical block placed.  A large shallow size LOOP used to remove cone of cervix using blend of cut and cautery on LEEP machine.  Edges/Base cauterized with Ball.  Monsel's solution used for hemostasis.  Patient tolerated procedure well. ? ?Patient given post procedure instructions.  Follow up in 6-12 months for repeat pap or as needed. ? ? ?

## 2022-04-25 LAB — SURGICAL PATHOLOGY

## 2022-10-07 ENCOUNTER — Other Ambulatory Visit (HOSPITAL_BASED_OUTPATIENT_CLINIC_OR_DEPARTMENT_OTHER): Payer: Self-pay

## 2022-10-07 MED ORDER — INFLUENZA VAC SPLIT QUAD 0.5 ML IM SUSY
PREFILLED_SYRINGE | INTRAMUSCULAR | 0 refills | Status: DC
  Filled 2022-10-07: qty 0.5, 1d supply, fill #0

## 2022-11-03 ENCOUNTER — Telehealth: Payer: Self-pay | Admitting: Internal Medicine

## 2022-11-03 NOTE — Telephone Encounter (Signed)
Copied from [place] [phone]. Topic: Referral - Status >> Nov 03, 2022  9:17 AM Cyndi Bender wrote: Reason for CRM: Pt requests that a referral for diagnostic mammogram and pap smear be placed or ordered. Cb# [phone]

## 2022-11-21 ENCOUNTER — Encounter: Payer: Self-pay | Admitting: Internal Medicine

## 2022-11-21 ENCOUNTER — Ambulatory Visit (INDEPENDENT_AMBULATORY_CARE_PROVIDER_SITE_OTHER): Payer: No Typology Code available for payment source | Admitting: Internal Medicine

## 2022-11-21 VITALS — BP 110/76 | HR 68 | Ht 61.0 in | Wt 136.0 lb

## 2022-11-21 DIAGNOSIS — Z131 Encounter for screening for diabetes mellitus: Secondary | ICD-10-CM

## 2022-11-21 DIAGNOSIS — D509 Iron deficiency anemia, unspecified: Secondary | ICD-10-CM

## 2022-11-21 DIAGNOSIS — R87613 High grade squamous intraepithelial lesion on cytologic smear of cervix (HGSIL): Secondary | ICD-10-CM

## 2022-11-21 DIAGNOSIS — Z1231 Encounter for screening mammogram for malignant neoplasm of breast: Secondary | ICD-10-CM | POA: Diagnosis not present

## 2022-11-21 DIAGNOSIS — Z0000 Encounter for general adult medical examination without abnormal findings: Secondary | ICD-10-CM | POA: Diagnosis not present

## 2022-11-21 DIAGNOSIS — D242 Benign neoplasm of left breast: Secondary | ICD-10-CM

## 2022-11-21 NOTE — Patient Instructions (Signed)
Call [facility] to schedule your mammogram at [phone].  

## 2022-11-21 NOTE — Progress Notes (Signed)
Date:  11/21/2022   Name:  Lauren Sampson   DOB:  Jun 22, 1971   MRN:  [id number]   Chief Complaint: Annual Exam Lauren Sampson is a 51 y.o. female who presents today for her Complete Annual Exam. She feels well. She reports exercising work out daily. She reports she is sleeping well. Breast complaints none.  Mammogram: 12/2021 DEXA: none Pap smear: 06/2021  HGSIL - referred to GYN. Colpo done 04/2022. Repeat one year. Colonoscopy: 03/2022 repeat 10 yrs  There are no preventive care reminders to display for this patient.  Immunization History  Administered Date(s) Administered   Influenza,inj,Quad PF,6+ Mos 10/07/2022   Influenza-Unspecified 09/20/2017, 09/21/2018   Tdap 11/30/2012    HPI  Lab Results  Component Value Date   NA 137 07/18/2021   K 3.3 (L) 07/18/2021   CO2 23 07/18/2021   GLUCOSE 94 07/18/2021   BUN 8 07/18/2021   CREATININE 0.61 07/18/2021   CALCIUM 8.6 (L) 07/18/2021   GFRNONAA >60 07/18/2021   Lab Results  Component Value Date   CHOL 144 07/18/2021   HDL 60 07/18/2021   LDLCALC 75 07/18/2021   TRIG 45 07/18/2021   CHOLHDL 2.4 07/18/2021   Lab Results  Component Value Date   TSH 1.231 07/18/2021   Lab Results  Component Value Date   HGBA1C 5.5 10/29/2017   Lab Results  Component Value Date   WBC 4.2 07/18/2021   HGB 11.7 (L) 07/18/2021   HCT 34.3 (L) 07/18/2021   MCV 98.0 07/18/2021   PLT 335 07/18/2021   Lab Results  Component Value Date   ALT 15 07/18/2021   AST 21 07/18/2021   ALKPHOS 51 07/18/2021   BILITOT 0.3 07/18/2021   No results found for: "25OHVITD2", "25OHVITD3", "VD25OH"   Review of Systems  Constitutional:  Negative for chills, fatigue and fever.  HENT:  Negative for congestion, hearing loss, tinnitus, trouble swallowing and voice change.   Eyes:  Negative for visual disturbance.  Respiratory:  Negative for cough, chest tightness, shortness of breath and wheezing.   Cardiovascular:  Negative for chest  pain, palpitations and leg swelling.  Gastrointestinal:  Negative for abdominal pain, constipation, diarrhea and vomiting.  Endocrine: Negative for polydipsia and polyuria.  Genitourinary:  Negative for dysuria, frequency, genital sores, menstrual problem, vaginal bleeding and vaginal discharge.  Musculoskeletal:  Negative for arthralgias, gait problem and joint swelling.  Skin:  Negative for color change and rash.  Neurological:  Negative for dizziness, tremors, light-headedness and headaches.  Hematological:  Negative for adenopathy. Does not bruise/bleed easily.  Psychiatric/Behavioral:  Negative for dysphoric mood and sleep disturbance. The patient is not nervous/anxious.     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   Lupus erythematosus 10/29/2017    No Known Allergies  Past Surgical History:  Procedure Laterality Date   breast biopsy Left 10/19/2020   FIBROEPITHELIAL LESION CONSISTENT WITH FIBROADENOMA, x clip   CERVICAL BIOPSY  W/ LOOP ELECTRODE EXCISION  04/2022   COLONOSCOPY WITH PROPOFOL N/A 04/10/2022   Procedure: COLONOSCOPY WITH PROPOFOL;  Surgeon: Lin Landsman, MD;  Location: [place];  Service: Endoscopy;  Laterality: N/A;    Social History   Tobacco Use   Smoking status: Never   Smokeless tobacco: Never  Vaping Use   Vaping Use: Never used  Substance Use Topics   Alcohol use: Yes    Alcohol/week: 2.0 - 4.0 standard drinks of alcohol    Types: 2 - 4 Cans of beer per week    Comment: Beer or WINE   Drug use: No     Medication list has been reviewed and updated.  Current Meds  Medication Sig   cholecalciferol (VITAMIN D) 1000 units tablet Take 1,000 Units daily by mouth.   Multiple Vitamins-Minerals (MULTIVITAMIN WITH MINERALS) tablet Take 1 tablet daily by mouth.   Omega-3 Fatty  Acids (FISH OIL) 1000 MG CAPS Take by mouth.       11/21/2022    8:03 AM 07/18/2021    9:39 AM 05/14/2020   10:41 AM  GAD 7 : Generalized Anxiety Score  Nervous, Anxious, on Edge 0 0 0  Control/stop worrying 0 0 0  Worry too much - different things 0 0 0  Trouble relaxing 0 0 0  Restless 0 0 0  Easily annoyed or irritable 0 0 0  Afraid - awful might happen 0 0 0  Total GAD 7 Score 0 0 0  Anxiety Difficulty Not difficult at all Not difficult at all Not difficult at all       11/21/2022    8:03 AM 07/18/2021    9:39 AM 05/14/2020   10:41 AM  Depression screen PHQ 2/9  Decreased Interest 0 0 0  Down, Depressed, Hopeless 0 0 0  PHQ - 2 Score 0 0 0  Altered sleeping 0 0 0  Tired, decreased energy 0 0 0  Change in appetite 0 0 0  Feeling bad or failure about yourself  0 0 0  Trouble concentrating 0 0 0  Moving slowly or fidgety/restless 0 0 0  Suicidal thoughts 0 0 0  PHQ-9 Score 0 0 0  Difficult doing work/chores Not difficult at all Not difficult at all Not difficult at all    BP Readings from Last 3 Encounters:  11/21/22 110/76  04/23/22 122/70  04/10/22 111/77    Physical Exam Vitals and nursing note reviewed.  Constitutional:      General: She is not in acute distress.    Appearance: She is well-developed.  HENT:     Head: Normocephalic and atraumatic.     Right Ear: Tympanic membrane and ear canal normal.     Left Ear: Tympanic membrane and ear canal normal.     Nose:     Right Sinus: No maxillary sinus tenderness.     Left Sinus: No maxillary sinus tenderness.  Eyes:     General: No scleral icterus.       Right eye: No discharge.        Left eye: No discharge.     Conjunctiva/sclera: Conjunctivae normal.  Neck:     Thyroid: No thyromegaly.     Vascular: No carotid bruit.  Cardiovascular:     Rate and Rhythm: Normal rate and regular rhythm.     Pulses: Normal pulses.     Heart sounds: Normal heart sounds.  Pulmonary:     Effort: Pulmonary effort is  normal. No respiratory distress.     Breath sounds: No wheezing.  Chest:  Breasts:    Right: No mass, nipple discharge, skin change or tenderness.     Left: No mass, nipple discharge, skin change or tenderness.       Comments: Vague fullness - no discrete mass Abdominal:     General: Bowel sounds are normal.  Palpations: Abdomen is soft.     Tenderness: There is no abdominal tenderness.  Musculoskeletal:     Cervical back: Normal range of motion. No erythema.     Right lower leg: No edema.     Left lower leg: No edema.  Lymphadenopathy:     Cervical: No cervical adenopathy.  Skin:    General: Skin is warm and dry.     Findings: No rash.  Neurological:     Mental Status: She is alert and oriented to person, place, and time.     Cranial Nerves: No cranial nerve deficit.     Sensory: No sensory deficit.     Deep Tendon Reflexes: Reflexes are normal and symmetric.  Psychiatric:        Attention and Perception: Attention normal.        Mood and Affect: Mood normal.     Wt Readings from Last 3 Encounters:  11/21/22 136 lb (61.7 kg)  04/23/22 134 lb (60.8 kg)  04/10/22 131 lb (59.4 kg)    BP 110/76   Pulse 68   Ht '5\' 1"'$  (1.549 m)   Wt 136 lb (61.7 kg)   LMP 11/11/2022   SpO2 98%   BMI 25.70 kg/m   Assessment and Plan: 1. Annual physical exam Normal exam Continue regular exercise and healthy diet - CBC with Differential/Platelet - Comprehensive metabolic panel - TSH - Hemoglobin A1c  2. Encounter for screening mammogram for breast cancer  3. Iron deficiency anemia, unspecified iron deficiency anemia type Check labs and advise - CBC with Differential/Platelet  4. Hypocalcemia - Comprehensive metabolic panel  5. Screening for diabetes mellitus - Hemoglobin A1c  6. HSIL on Pap smear of cervix S/p LEEP Will see GYN next May for repeat pap  7. Fibroadenoma of left breast - MM DIAG BREAST TOMO BILATERAL - US BREAST LTD UNI LEFT INC AXILLA   Partially  dictated using Editor, commissioning. Any errors are unintentional.  Halina Maidens, MD [facility]  11/21/2022

## 2022-11-22 LAB — CBC WITH DIFFERENTIAL/PLATELET
Basophils Absolute: 0 10*3/uL (ref 0.0–0.2)
Basos: 1 %
EOS (ABSOLUTE): 0.1 10*3/uL (ref 0.0–0.4)
Eos: 2 %
Hematocrit: 34.2 % (ref 34.0–46.6)
Hemoglobin: 11.5 g/dL (ref 11.1–15.9)
Immature Grans (Abs): 0 10*3/uL (ref 0.0–0.1)
Immature Granulocytes: 0 %
Lymphocytes Absolute: 2.2 10*3/uL (ref 0.7–3.1)
Lymphs: 55 %
MCH: 32.9 pg (ref 26.6–33.0)
MCHC: 33.6 g/dL (ref 31.5–35.7)
MCV: 98 fL — ABNORMAL HIGH (ref 79–97)
Monocytes Absolute: 0.4 10*3/uL (ref 0.1–0.9)
Monocytes: 10 %
Neutrophils Absolute: 1.2 10*3/uL — ABNORMAL LOW (ref 1.4–7.0)
Neutrophils: 32 %
Platelets: 328 10*3/uL (ref 150–450)
RBC: 3.5 x10E6/uL — ABNORMAL LOW (ref 3.77–5.28)
RDW: 12.5 % (ref 11.7–15.4)
WBC: 3.9 10*3/uL (ref 3.4–10.8)

## 2022-11-22 LAB — HEMOGLOBIN A1C
Est. average glucose Bld gHb Est-mCnc: 103 mg/dL
Hgb A1c MFr Bld: 5.2 % (ref 4.8–5.6)

## 2022-11-22 LAB — COMPREHENSIVE METABOLIC PANEL
ALT: 14 IU/L (ref 0–32)
AST: 17 IU/L (ref 0–40)
Albumin/Globulin Ratio: 1.5 (ref 1.2–2.2)
Albumin: 4.4 g/dL (ref 3.9–4.9)
Alkaline Phosphatase: 65 IU/L (ref 44–121)
BUN/Creatinine Ratio: 12 (ref 9–23)
BUN: 8 mg/dL (ref 6–24)
Bilirubin Total: 0.2 mg/dL (ref 0.0–1.2)
CO2: 22 mmol/L (ref 20–29)
Calcium: 9.3 mg/dL (ref 8.7–10.2)
Chloride: 102 mmol/L (ref 96–106)
Creatinine, Ser: 0.67 mg/dL (ref 0.57–1.00)
Globulin, Total: 2.9 g/dL (ref 1.5–4.5)
Glucose: 89 mg/dL (ref 70–99)
Potassium: 4.8 mmol/L (ref 3.5–5.2)
Sodium: 140 mmol/L (ref 134–144)
Total Protein: 7.3 g/dL (ref 6.0–8.5)
eGFR: 107 mL/min/{1.73_m2} (ref >59)

## 2022-11-22 LAB — TSH: TSH: 1.82 u[IU]/mL (ref 0.450–4.500)

## 2023-01-07 ENCOUNTER — Ambulatory Visit
Admission: RE | Admit: 2023-01-07 | Discharge: 2023-01-07 | Disposition: A | Discharge status: Home / Self Care | Payer: 59 | Source: Ambulatory Visit | Attending: Internal Medicine | Admitting: Internal Medicine

## 2023-01-07 DIAGNOSIS — N6324 Unspecified lump in the left breast, lower inner quadrant: Secondary | ICD-10-CM | POA: Diagnosis not present

## 2023-01-07 DIAGNOSIS — D242 Benign neoplasm of left breast: Secondary | ICD-10-CM | POA: Diagnosis not present

## 2023-01-07 DIAGNOSIS — R922 Inconclusive mammogram: Secondary | ICD-10-CM | POA: Diagnosis not present

## 2023-11-03 IMAGING — MG DIGITAL DIAGNOSTIC BILAT W/ TOMO W/ CAD
8 series · 8 of 24 positions shown · non-contrast
Comparison: Previous exam(s).

CLINICAL DATA: 50-year-old female presenting for annual exam.
Patient had a left breast biopsy in September 2020 demonstrating
fibroepithelial lesion for which excision was recommended. Patient
did not pursue surgical consultation. Additionally recommendation
was made for a biopsy of an enlarged left axillary lymph node which
was declined by the patient.

EXAM:
DIGITAL DIAGNOSTIC BILATERAL MAMMOGRAM WITH TOMOSYNTHESIS AND CAD;
ULTRASOUND LEFT BREAST LIMITED
TECHNIQUE: Bilateral digital diagnostic mammography and breast tomosynthesis
was performed. The images were evaluated with computer-aided
detection.; Targeted ultrasound examination of the left breast was
performed.

[L MLO synth-2D]
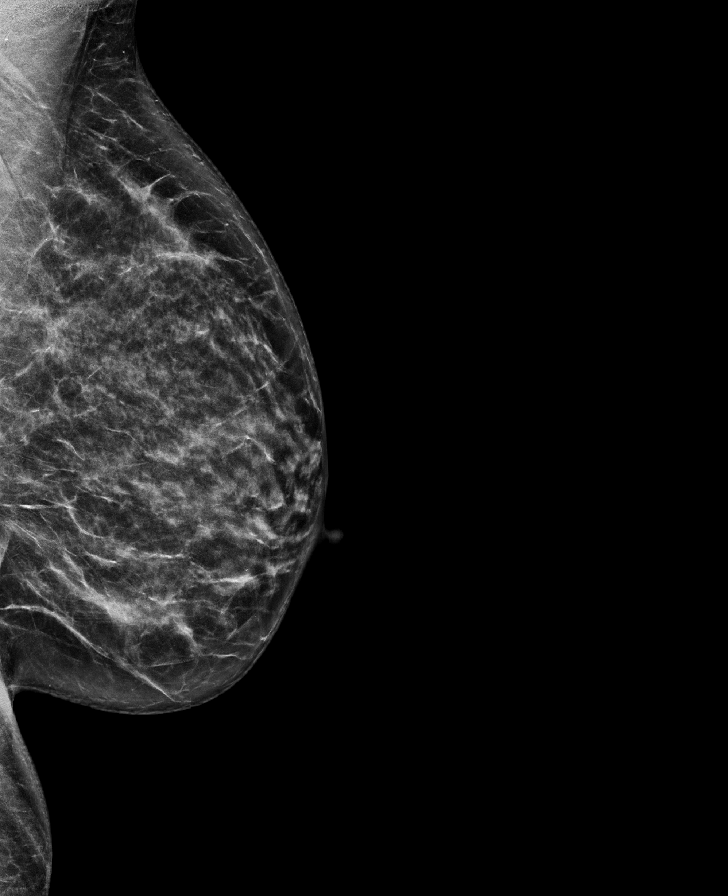

[L CC synth-2D]
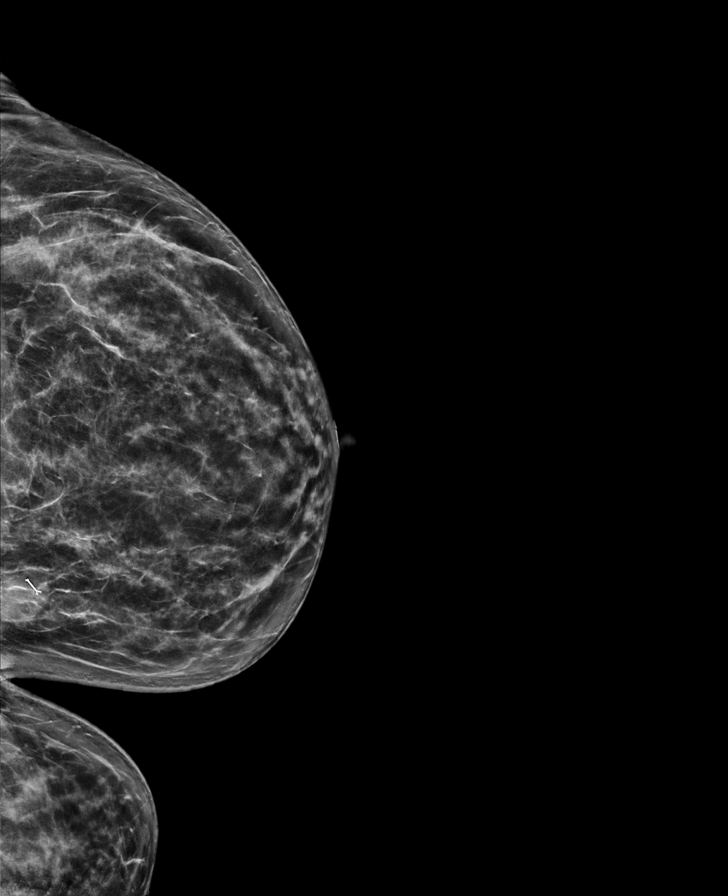

[R MLO synth-2D]
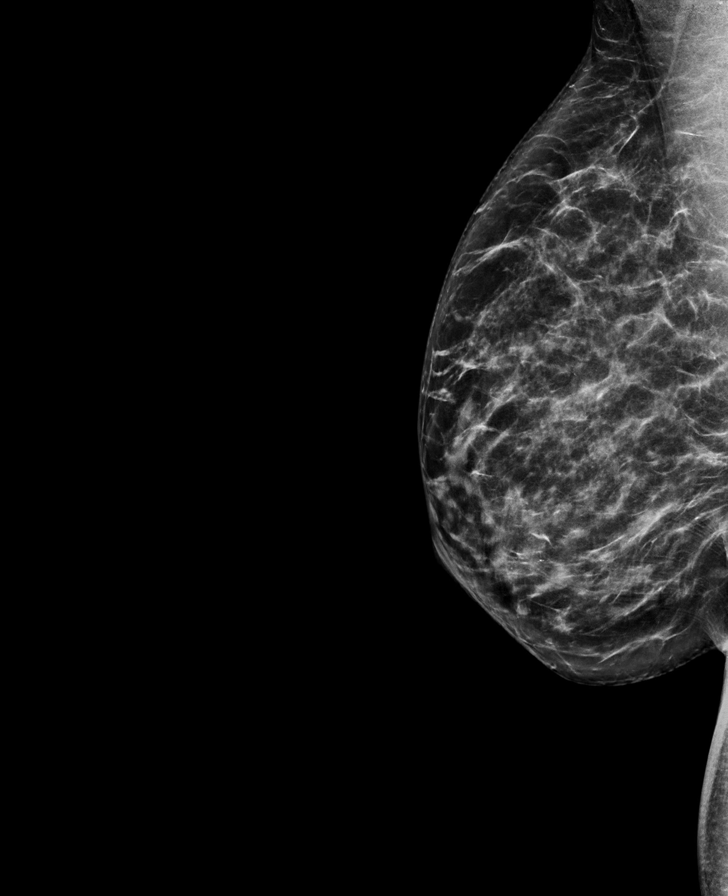

[R CC synth-2D]
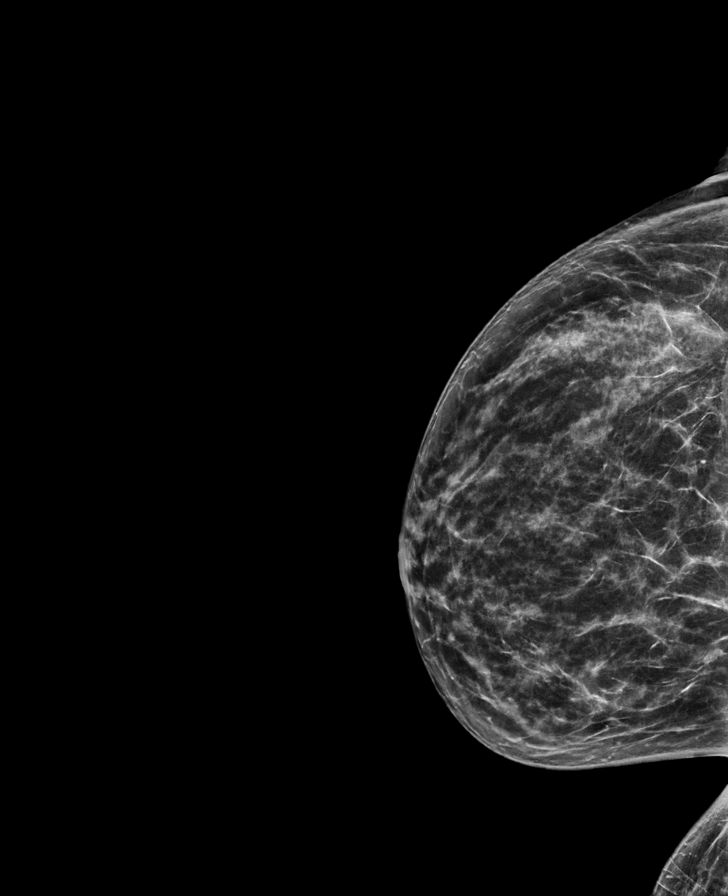

[R MLO tomo · tomo slice 37/74.0]
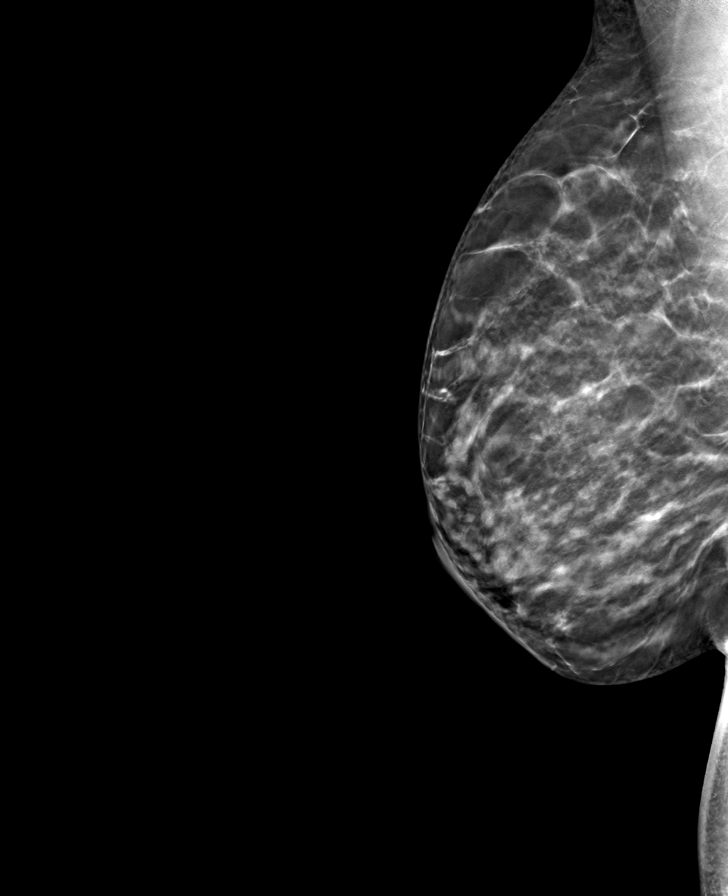

[R CC tomo · tomo slice 35/68.0]
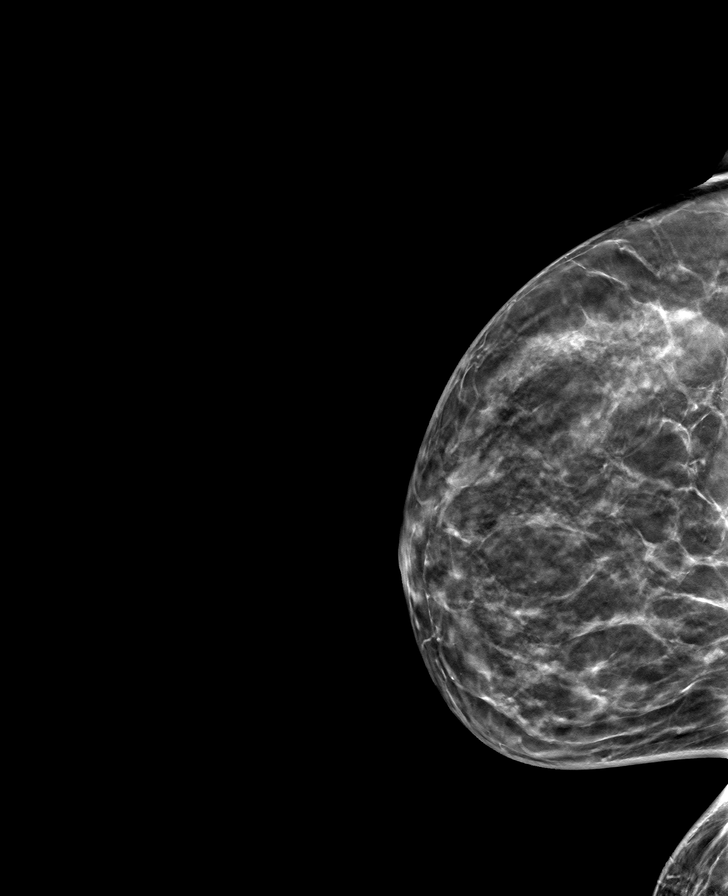

[L MLO tomo · tomo slice 36/71.0]
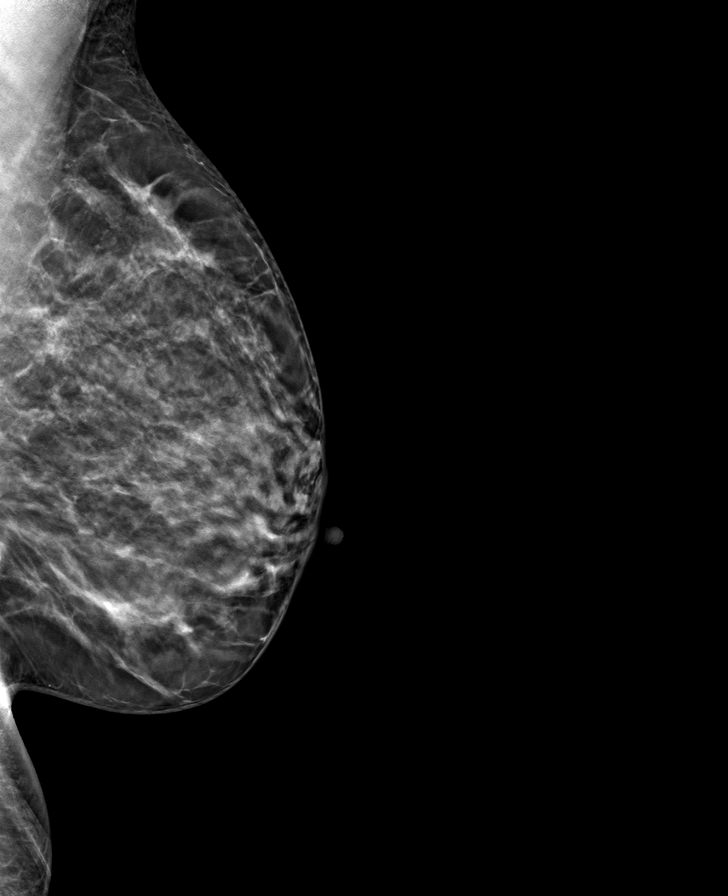

[L CC tomo · tomo slice 37/72.0]
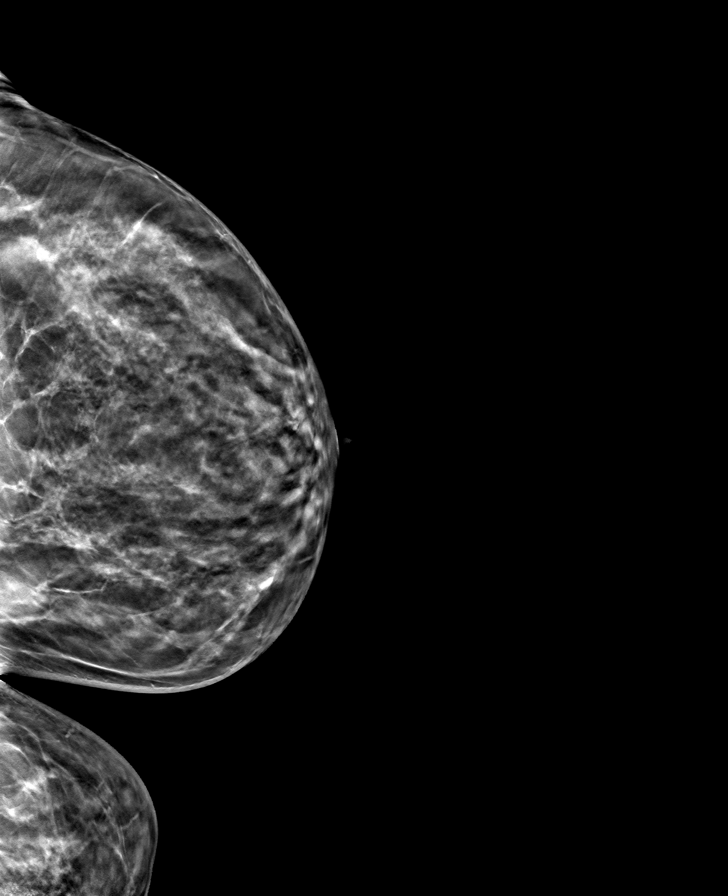

[8 of 24 positions shown; findings below may reference images not displayed]

ACR Breast Density Category c: The breast tissue is heterogeneously
dense, which may obscure small masses.
FINDINGS: Mammogram:

Right breast: No suspicious mass, distortion, or microcalcifications
are identified to suggest presence of malignancy.

Left breast: There is an oval mass in the lower inner left breast
posterior depth with associated biopsy marking clip which has
decreased in size compared to the prior mammogram. There are no new
suspicious findings elsewhere in the left breast to suggest the
presence of malignancy.

Ultrasound:

Targeted ultrasound performed in the left breast at 8:30 o'clock 9
cm from the nipple demonstrating an oval circumscribed hypoechoic
mass measuring 2.2 x 1.0 x 1.8 cm, previously measuring 3.2 x 1.2 x
2.3 cm.

Targeted ultrasound the left axilla demonstrates lymph nodes with
normal morphology and fatty hila. Cortical thickness measures up to
0.5 cm. These are similar in appearance to prior. There is no new
suspicious lymph node or mass.
IMPRESSION: 1. Interval decrease in size of the biopsy the left breast mass at
8:30 o'clock demonstrating fibroepithelial lesion.

2. Stable appearance of probably benign left axillary lymph nodes
with cortical thickness measuring up to 0.5 cm.

RECOMMENDATION:
Diagnostic bilateral mammogram and possible left breast ultrasound
in 1 year.

I have discussed the findings and recommendations with the patient.
If applicable, a reminder letter will be sent to the patient
regarding the next appointment.

BI-RADS CATEGORY  3: Probably benign.

## 2023-11-03 IMAGING — US US BREAST*L* LIMITED INC AXILLA
1 series · 13 of 18 positions shown · non-contrast
Comparison: Previous exam(s).

CLINICAL DATA: 50-year-old female presenting for annual exam.
Patient had a left breast biopsy in September 2020 demonstrating
fibroepithelial lesion for which excision was recommended. Patient
did not pursue surgical consultation. Additionally recommendation
was made for a biopsy of an enlarged left axillary lymph node which
was declined by the patient.

EXAM:
DIGITAL DIAGNOSTIC BILATERAL MAMMOGRAM WITH TOMOSYNTHESIS AND CAD;
ULTRASOUND LEFT BREAST LIMITED
TECHNIQUE: Bilateral digital diagnostic mammography and breast tomosynthesis
was performed. The images were evaluated with computer-aided
detection.; Targeted ultrasound examination of the left breast was
performed.

[Series 1: us breast*left* limited inc axilla · 0.06mm/px · 13 of 18 slices shown]
[im 1/18]
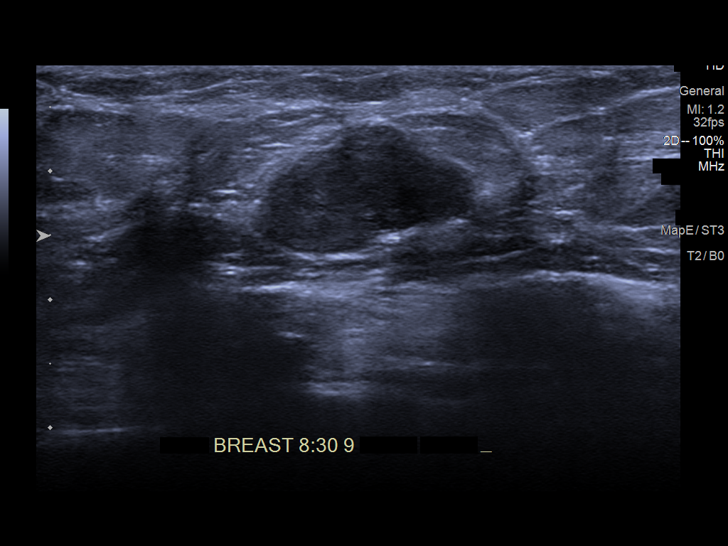
[im 3/18]
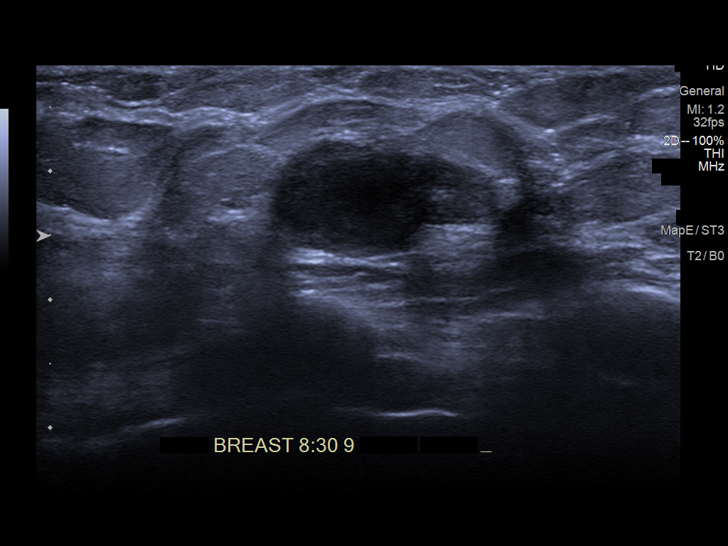
[im 4/18]
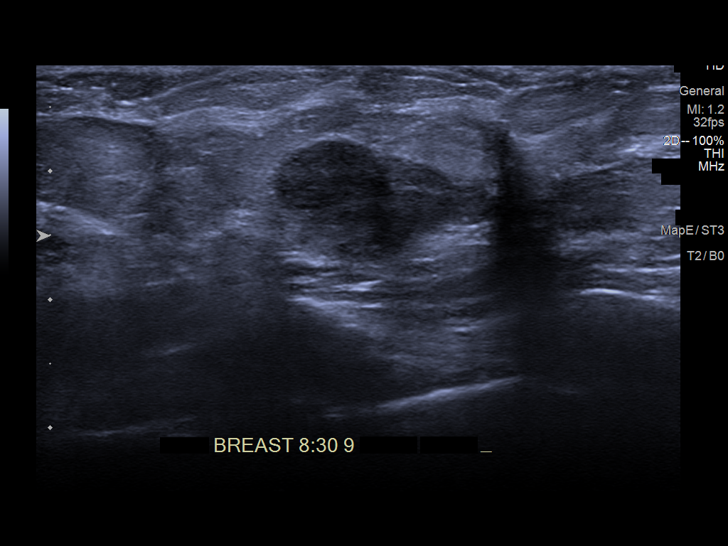
[im 5/18]
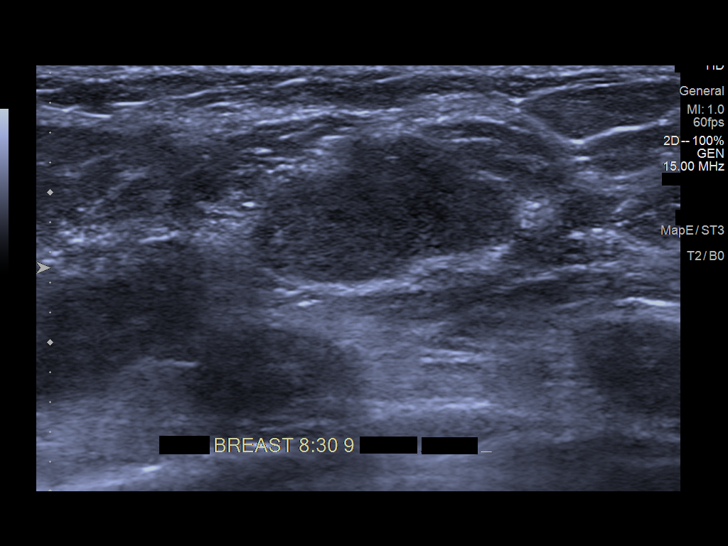
[im 7/18]
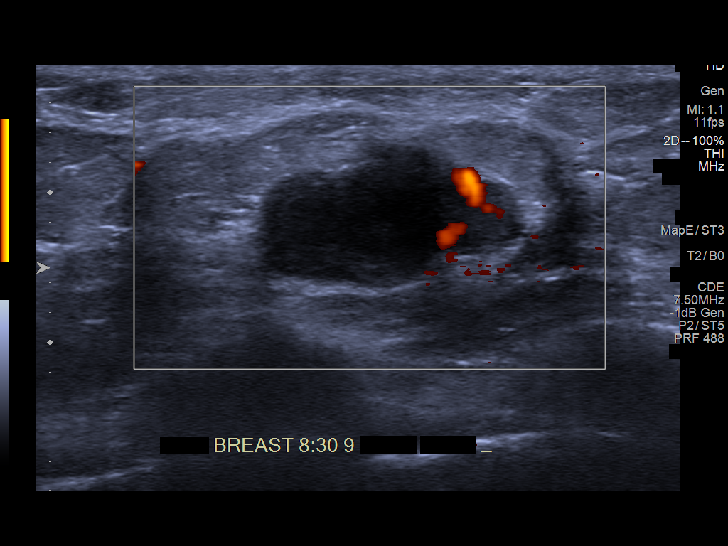
[im 8/18]
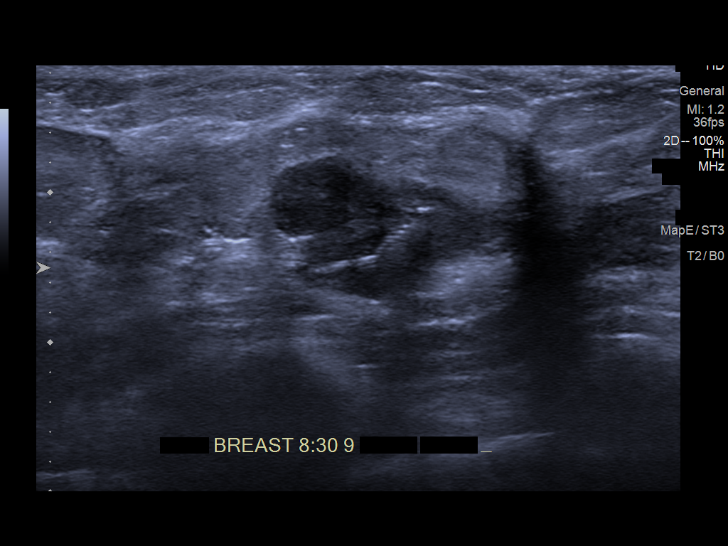
[im 10/18]
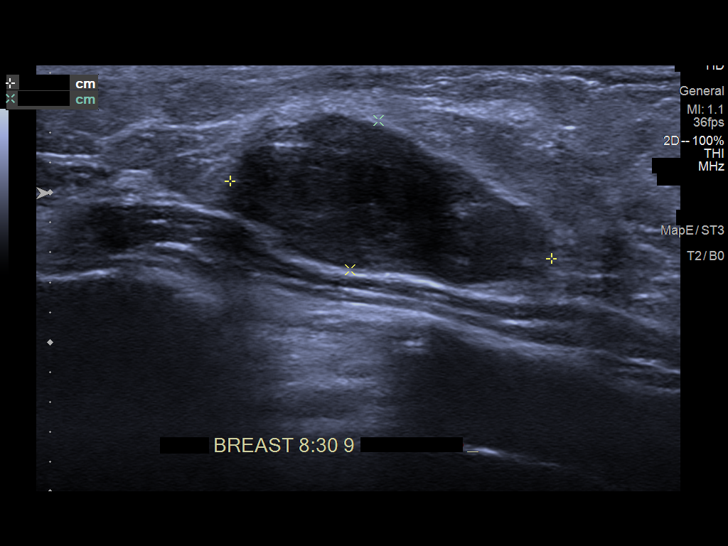
[im 11/18]
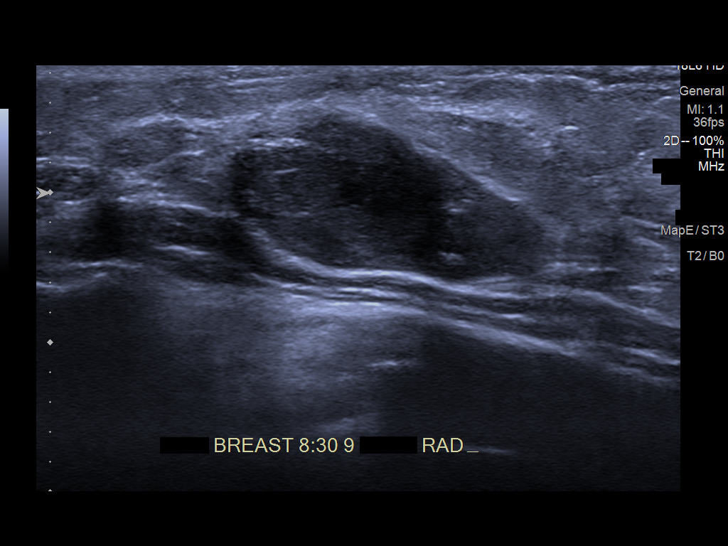
[im 12/18]
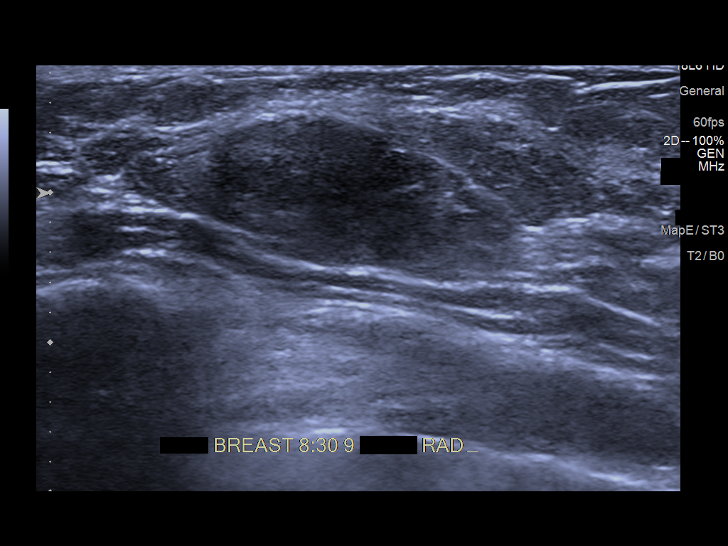
[im 14/18]
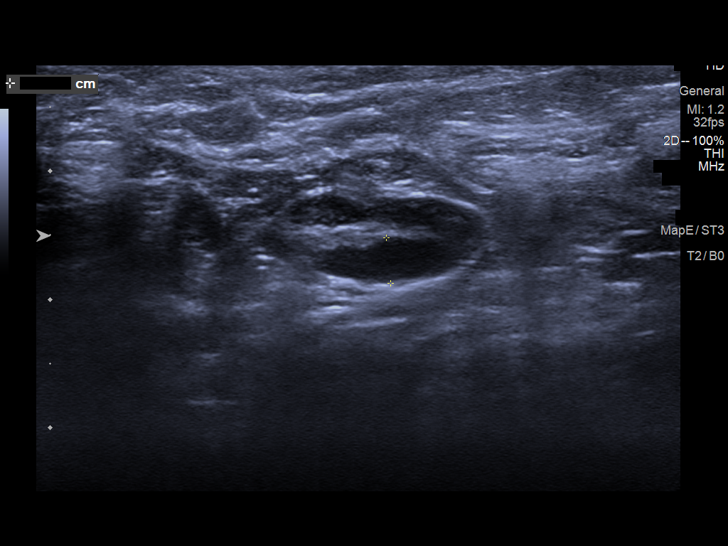
[im 15/18]
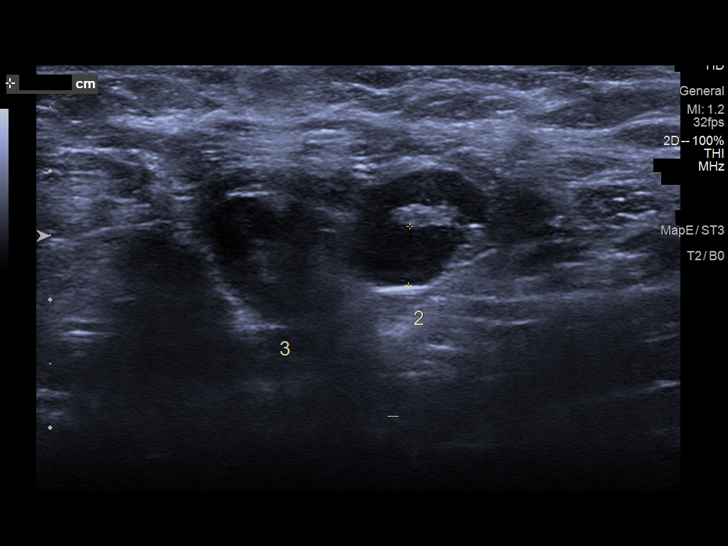
[im 16/18]
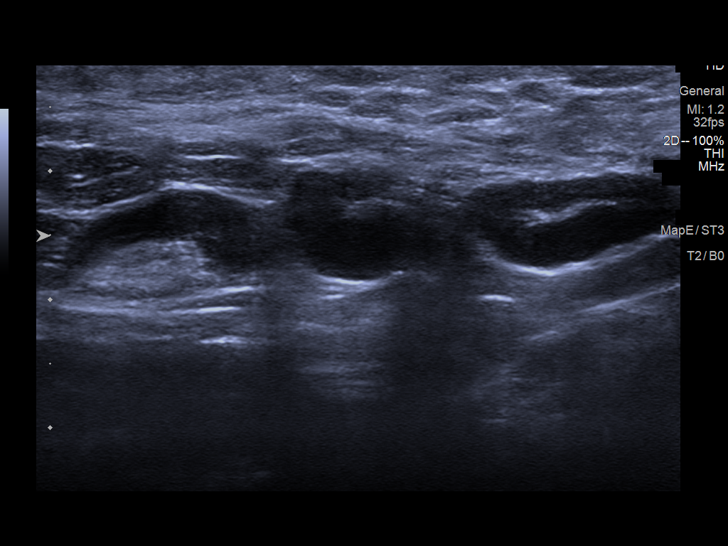
[im 18/18]
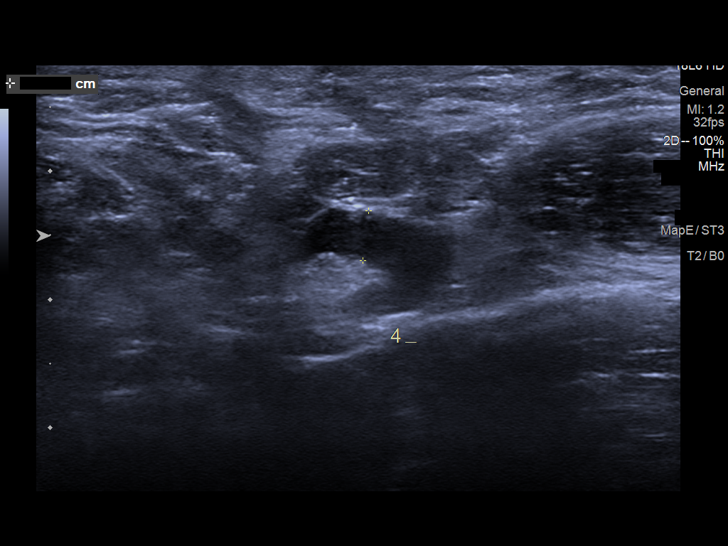

[13 of 18 positions shown; findings below may reference images not displayed]

ACR Breast Density Category c: The breast tissue is heterogeneously
dense, which may obscure small masses.
FINDINGS: Mammogram:

Right breast: No suspicious mass, distortion, or microcalcifications
are identified to suggest presence of malignancy.

Left breast: There is an oval mass in the lower inner left breast
posterior depth with associated biopsy marking clip which has
decreased in size compared to the prior mammogram. There are no new
suspicious findings elsewhere in the left breast to suggest the
presence of malignancy.

Ultrasound:

Targeted ultrasound performed in the left breast at 8:30 o'clock 9
cm from the nipple demonstrating an oval circumscribed hypoechoic
mass measuring 2.2 x 1.0 x 1.8 cm, previously measuring 3.2 x 1.2 x
2.3 cm.

Targeted ultrasound the left axilla demonstrates lymph nodes with
normal morphology and fatty hila. Cortical thickness measures up to
0.5 cm. These are similar in appearance to prior. There is no new
suspicious lymph node or mass.
IMPRESSION: 1. Interval decrease in size of the biopsy the left breast mass at
8:30 o'clock demonstrating fibroepithelial lesion.

2. Stable appearance of probably benign left axillary lymph nodes
with cortical thickness measuring up to 0.5 cm.

RECOMMENDATION:
Diagnostic bilateral mammogram and possible left breast ultrasound
in 1 year.

I have discussed the findings and recommendations with the patient.
If applicable, a reminder letter will be sent to the patient
regarding the next appointment.

BI-RADS CATEGORY  3: Probably benign.

## 2023-12-08 ENCOUNTER — Telehealth: Payer: Self-pay

## 2023-12-08 NOTE — Telephone Encounter (Signed)
Called patient and left VM asking her to call back and reschedule her appt from January 21st to another date and time. Dr Judithann Graves is taking this day off.  - Lauren Sampson

## 2023-12-11 ENCOUNTER — Encounter: Payer: Self-pay | Admitting: Internal Medicine

## 2023-12-11 ENCOUNTER — Other Ambulatory Visit: Payer: Self-pay | Admitting: Internal Medicine

## 2023-12-11 DIAGNOSIS — Z1231 Encounter for screening mammogram for malignant neoplasm of breast: Secondary | ICD-10-CM

## 2024-01-12 ENCOUNTER — Encounter: Payer: 59 | Admitting: Internal Medicine

## 2024-01-18 ENCOUNTER — Ambulatory Visit
Admission: RE | Admit: 2024-01-18 | Discharge: 2024-01-18 | Disposition: A | Discharge status: Home / Self Care | Payer: 59 | Source: Ambulatory Visit | Attending: Internal Medicine | Admitting: Internal Medicine

## 2024-01-18 DIAGNOSIS — Z1231 Encounter for screening mammogram for malignant neoplasm of breast: Secondary | ICD-10-CM | POA: Diagnosis not present

## 2024-04-22 ENCOUNTER — Ambulatory Visit (INDEPENDENT_AMBULATORY_CARE_PROVIDER_SITE_OTHER): Payer: Self-pay | Admitting: Internal Medicine

## 2024-04-22 ENCOUNTER — Encounter: Payer: Self-pay | Admitting: Internal Medicine

## 2024-04-22 VITALS — BP 112/72 | HR 75 | Ht 61.0 in | Wt 124.1 lb

## 2024-04-22 DIAGNOSIS — Z1231 Encounter for screening mammogram for malignant neoplasm of breast: Secondary | ICD-10-CM | POA: Diagnosis not present

## 2024-04-22 DIAGNOSIS — Z13220 Encounter for screening for lipoid disorders: Secondary | ICD-10-CM

## 2024-04-22 DIAGNOSIS — R87613 High grade squamous intraepithelial lesion on cytologic smear of cervix (HGSIL): Secondary | ICD-10-CM | POA: Diagnosis not present

## 2024-04-22 DIAGNOSIS — Z0000 Encounter for general adult medical examination without abnormal findings: Secondary | ICD-10-CM

## 2024-04-22 DIAGNOSIS — Z131 Encounter for screening for diabetes mellitus: Secondary | ICD-10-CM | POA: Diagnosis not present

## 2024-04-22 DIAGNOSIS — D508 Other iron deficiency anemias: Secondary | ICD-10-CM

## 2024-04-22 NOTE — Assessment & Plan Note (Signed)
 S/p conization in 2023 and recommended follow up Pap in one year She did not follow up - was not aware of the needed. Currently having her menstrual cycle so will refer to [facility] for follow op.

## 2024-04-22 NOTE — Progress Notes (Signed)
 Date:  04/22/2024   Name:  Lauren Sampson   DOB:  Feb 22, 1971   MRN:  [id number]   Chief Complaint: Annual Exam Lauren Sampson is a 54 y.o. female who presents today for her Complete Annual Exam. She feels well. She reports exercising daily at the gym. She reports she is sleeping well. Breast complaints none.  Health Maintenance  Topic Date Due   COVID-19 Vaccine (1) Never done   DTaP/Tdap/Td vaccine (2 - Td or Tdap) 11/30/2022   Zoster (Shingles) Vaccine (1 of 2) 07/23/2024*   Flu Shot  07/22/2024   Mammogram  01/17/2025   Pap with HPV screening  07/18/2026   Colon Cancer Screening  04/10/2032   Hepatitis C Screening  Completed   HIV Screening  Completed   HPV Vaccine  Aged Out   Meningitis B Vaccine  Aged Out  *Topic was postponed. The date shown is not the original due date.    HPI  Review of Systems  Constitutional:  Negative for chills, fatigue and unexpected weight change.  Eyes:  Negative for visual disturbance.  Respiratory:  Negative for cough, chest tightness, shortness of breath and wheezing.   Cardiovascular:  Negative for chest pain, palpitations and leg swelling.  Gastrointestinal:  Negative for abdominal pain, constipation and diarrhea.  Genitourinary:  Negative for dysuria, frequency, pelvic pain and vaginal pain.  Musculoskeletal:  Negative for arthralgias and myalgias.  Skin:  Negative for color change and rash.  Neurological:  Negative for dizziness, weakness, light-headedness and headaches.  Psychiatric/Behavioral:  Negative for dysphoric mood and sleep disturbance. The patient is not nervous/anxious.      Lab Results  Component Value Date   NA 140 11/21/2022   K 4.8 11/21/2022   CO2 22 11/21/2022   GLUCOSE 89 11/21/2022   BUN 8 11/21/2022   CREATININE 0.67 11/21/2022   CALCIUM 9.3 11/21/2022   EGFR 107 11/21/2022   GFRNONAA >60 07/18/2021   Lab Results  Component Value Date   CHOL 144 07/18/2021   HDL 60 07/18/2021   LDLCALC 75  07/18/2021   TRIG 45 07/18/2021   CHOLHDL 2.4 07/18/2021   Lab Results  Component Value Date   TSH 1.820 11/21/2022   Lab Results  Component Value Date   HGBA1C 5.2 11/21/2022   Lab Results  Component Value Date   WBC 3.9 11/21/2022   HGB 11.5 11/21/2022   HCT 34.2 11/21/2022   MCV 98 (H) 11/21/2022   PLT 328 11/21/2022   Lab Results  Component Value Date   ALT 14 11/21/2022   AST 17 11/21/2022   ALKPHOS 65 11/21/2022   BILITOT 0.2 11/21/2022   No results found for: "25OHVITD2", "25OHVITD3", "VD25OH"   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   Lupus erythematosus 10/29/2017    No Known Allergies  Past Surgical History:  Procedure Laterality Date   breast biopsy Left 10/19/2020   FIBROEPITHELIAL LESION CONSISTENT WITH FIBROADENOMA, x clip   CERVICAL BIOPSY  W/ LOOP ELECTRODE EXCISION  04/2022   COLONOSCOPY WITH PROPOFOL  N/A 04/10/2022   Procedure: COLONOSCOPY WITH PROPOFOL ;  Surgeon: Selena Daily, MD;  Location: [facility];  Service: Endoscopy;  Laterality: N/A;    Social History   Tobacco Use   Smoking status: Never   Smokeless tobacco: Never  Vaping  Use   Vaping status: Never Used  Substance Use Topics   Alcohol use: Yes    Alcohol/week: 2.0 - 4.0 standard drinks of alcohol    Types: 2 - 4 Cans of beer per week    Comment: Beer or WINE   Drug use: No     Medication list has been reviewed and updated.  Current Meds  Medication Sig   cholecalciferol (VITAMIN D) 1000 units tablet Take 1,000 Units daily by mouth.   Multiple Vitamins-Minerals (MULTIVITAMIN WITH MINERALS) tablet Take 1 tablet daily by mouth.   Omega-3 Fatty Acids (FISH OIL) 1000 MG CAPS Take by mouth.       04/22/2024   11:16 AM 11/21/2022    8:03 AM 07/18/2021    9:39 AM 05/14/2020   10:41 AM  GAD 7 :  Generalized Anxiety Score  Nervous, Anxious, on Edge 0 0 0 0  Control/stop worrying 0 0 0 0  Worry too much - different things 0 0 0 0  Trouble relaxing 0 0 0 0  Restless 0 0 0 0  Easily annoyed or irritable 1 0 0 0  Afraid - awful might happen 0 0 0 0  Total GAD 7 Score 1 0 0 0  Anxiety Difficulty Not difficult at all Not difficult at all Not difficult at all Not difficult at all       04/22/2024   10:49 AM 11/21/2022    8:03 AM 07/18/2021    9:39 AM  Depression screen PHQ 2/9  Decreased Interest 0 0 0  Down, Depressed, Hopeless 1 0 0  PHQ - 2 Score 1 0 0  Altered sleeping 0 0 0  Tired, decreased energy 0 0 0  Change in appetite 0 0 0  Feeling bad or failure about yourself  0 0 0  Trouble concentrating 0 0 0  Moving slowly or fidgety/restless 0 0 0  Suicidal thoughts 0 0 0  PHQ-9 Score 1 0 0  Difficult doing work/chores Not difficult at all Not difficult at all Not difficult at all    BP Readings from Last 3 Encounters:  04/22/24 112/72  11/21/22 110/76  04/23/22 122/70    Physical Exam Vitals and nursing note reviewed.  Constitutional:      General: She is not in acute distress.    Appearance: She is well-developed.  HENT:     Head: Normocephalic and atraumatic.  Eyes:     General: No scleral icterus.       Right eye: No discharge.        Left eye: No discharge.     Conjunctiva/sclera: Conjunctivae normal.  Neck:     Thyroid : No thyromegaly.     Vascular: No carotid bruit.  Cardiovascular:     Rate and Rhythm: Normal rate and regular rhythm.     Pulses: Normal pulses.     Heart sounds: Normal heart sounds.  Pulmonary:     Effort: Pulmonary effort is normal. No respiratory distress.     Breath sounds: No wheezing.  Abdominal:     General: Bowel sounds are normal.     Palpations: Abdomen is soft.     Tenderness: There is no abdominal tenderness.  Musculoskeletal:     Cervical back: Normal range of motion. No erythema.     Right lower leg: No edema.      Left lower leg: No edema.  Lymphadenopathy:     Cervical: No cervical adenopathy.  Skin:    General: Skin is  warm and dry.     Findings: No rash.  Neurological:     Mental Status: She is alert and oriented to person, place, and time.     Cranial Nerves: No cranial nerve deficit.     Sensory: No sensory deficit.     Deep Tendon Reflexes: Reflexes are normal and symmetric.  Psychiatric:        Attention and Perception: Attention normal.        Mood and Affect: Mood normal.     Wt Readings from Last 3 Encounters:  04/22/24 124 lb 2 oz (56.3 kg)  11/21/22 136 lb (61.7 kg)  04/23/22 134 lb (60.8 kg)    BP 112/72   Pulse 75   Ht 5\' 1"  (1.549 m)   Wt 124 lb 2 oz (56.3 kg)   LMP 04/19/2024 (Approximate)   SpO2 98%   BMI 23.45 kg/m   Assessment and Plan:  Problem List Items Addressed This Visit       Unprioritized   HSIL on Pap smear of cervix   S/p conization in 2023 and recommended follow up Pap in one year She did not follow up - was not aware of the needed. Currently having her menstrual cycle so will refer to [facility] for follow op.      Relevant Orders   Ambulatory referral to Obstetrics / Gynecology   Anemia, iron deficiency   Taking only multivitamin with iron. No complaints currently.      Relevant Orders   CBC with Differential/Platelet   Iron, TIBC and Ferritin Panel   Other Visit Diagnoses       Annual physical exam    -  Primary   continue healthy diet and exercise she declines Shingrix she will check on Tdap status   Relevant Orders   CBC with Differential/Platelet   Iron, TIBC and Ferritin Panel   Comprehensive metabolic panel with GFR   Lipid panel   TSH     Encounter for screening mammogram for breast cancer       completed in January - recommend annual screening     Screening for lipid disorders       Relevant Orders   Lipid panel     Screening for diabetes mellitus       Relevant Orders   Comprehensive metabolic panel with GFR    Hemoglobin A1c       No follow-ups on file.    Sheron Dixons, MD [facility] [place]

## 2024-04-22 NOTE — Assessment & Plan Note (Signed)
 Taking only multivitamin with iron. No complaints currently.

## 2024-04-23 LAB — CBC WITH DIFFERENTIAL/PLATELET
Basophils Absolute: 0 10*3/uL (ref 0.0–0.2)
Basos: 1 %
EOS (ABSOLUTE): 0.1 10*3/uL (ref 0.0–0.4)
Eos: 3 %
Hematocrit: 36.6 % (ref 34.0–46.6)
Hemoglobin: 12.3 g/dL (ref 11.1–15.9)
Immature Grans (Abs): 0 10*3/uL (ref 0.0–0.1)
Immature Granulocytes: 0 %
Lymphocytes Absolute: 2.1 10*3/uL (ref 0.7–3.1)
Lymphs: 63 %
MCH: 33.1 pg — ABNORMAL HIGH (ref 26.6–33.0)
MCHC: 33.6 g/dL (ref 31.5–35.7)
MCV: 98 fL — ABNORMAL HIGH (ref 79–97)
Monocytes Absolute: 0.3 10*3/uL (ref 0.1–0.9)
Monocytes: 9 %
Neutrophils Absolute: 0.8 10*3/uL — ABNORMAL LOW (ref 1.4–7.0)
Neutrophils: 24 %
Platelets: 311 10*3/uL (ref 150–450)
RBC: 3.72 x10E6/uL — ABNORMAL LOW (ref 3.77–5.28)
RDW: 12.6 % (ref 11.7–15.4)
WBC: 3.4 10*3/uL (ref 3.4–10.8)

## 2024-04-23 LAB — LIPID PANEL
Chol/HDL Ratio: 3.3 ratio (ref 0.0–4.4)
Cholesterol, Total: 184 mg/dL (ref 100–199)
HDL: 56 mg/dL (ref >39)
LDL Chol Calc (NIH): 116 mg/dL — ABNORMAL HIGH (ref 0–99)
Triglycerides: 64 mg/dL (ref 0–149)
VLDL Cholesterol Cal: 12 mg/dL (ref 5–40)

## 2024-04-23 LAB — IRON,TIBC AND FERRITIN PANEL
Ferritin: 75 ng/mL (ref 15–150)
Iron Saturation: 24 % (ref 15–55)
Iron: 61 ug/dL (ref 27–159)
Total Iron Binding Capacity: 252 ug/dL (ref 250–450)
UIBC: 191 ug/dL (ref 131–425)

## 2024-04-23 LAB — COMPREHENSIVE METABOLIC PANEL WITH GFR
ALT: 19 IU/L (ref 0–32)
AST: 25 IU/L (ref 0–40)
Albumin: 4.6 g/dL (ref 3.8–4.9)
Alkaline Phosphatase: 73 IU/L (ref 44–121)
BUN/Creatinine Ratio: 5 — ABNORMAL LOW (ref 9–23)
BUN: 4 mg/dL — ABNORMAL LOW (ref 6–24)
Bilirubin Total: 0.4 mg/dL (ref 0.0–1.2)
CO2: 22 mmol/L (ref 20–29)
Calcium: 9.6 mg/dL (ref 8.7–10.2)
Chloride: 104 mmol/L (ref 96–106)
Creatinine, Ser: 0.83 mg/dL (ref 0.57–1.00)
Globulin, Total: 3.1 g/dL (ref 1.5–4.5)
Glucose: 88 mg/dL (ref 70–99)
Potassium: 4.1 mmol/L (ref 3.5–5.2)
Sodium: 141 mmol/L (ref 134–144)
Total Protein: 7.7 g/dL (ref 6.0–8.5)
eGFR: 85 mL/min/{1.73_m2} (ref >59)

## 2024-04-23 LAB — HEMOGLOBIN A1C
Est. average glucose Bld gHb Est-mCnc: 105 mg/dL
Hgb A1c MFr Bld: 5.3 % (ref 4.8–5.6)

## 2024-04-23 LAB — TSH: TSH: 1.92 u[IU]/mL (ref 0.450–4.500)

## 2024-04-25 ENCOUNTER — Encounter: Payer: Self-pay | Admitting: Internal Medicine

## 2024-05-11 ENCOUNTER — Encounter: Payer: Self-pay | Admitting: Certified Nurse Midwife

## 2024-05-11 ENCOUNTER — Other Ambulatory Visit (HOSPITAL_COMMUNITY)
Admission: RE | Admit: 2024-05-11 | Discharge: 2024-05-11 | Disposition: A | Discharge status: Home / Self Care | Source: Ambulatory Visit | Attending: Certified Nurse Midwife | Admitting: Certified Nurse Midwife

## 2024-05-11 ENCOUNTER — Ambulatory Visit (INDEPENDENT_AMBULATORY_CARE_PROVIDER_SITE_OTHER): Admitting: Certified Nurse Midwife

## 2024-05-11 VITALS — BP 119/79 | HR 68 | Ht 61.0 in | Wt 124.8 lb

## 2024-05-11 DIAGNOSIS — Z01419 Encounter for gynecological examination (general) (routine) without abnormal findings: Secondary | ICD-10-CM | POA: Diagnosis not present

## 2024-05-11 DIAGNOSIS — Z124 Encounter for screening for malignant neoplasm of cervix: Secondary | ICD-10-CM | POA: Insufficient documentation

## 2024-05-11 DIAGNOSIS — Z1151 Encounter for screening for human papillomavirus (HPV): Secondary | ICD-10-CM | POA: Insufficient documentation

## 2024-05-11 DIAGNOSIS — Z9889 Other specified postprocedural states: Secondary | ICD-10-CM

## 2024-05-11 NOTE — Progress Notes (Signed)
 ANNUAL EXAM Patient name: Lauren Sampson MRN [id number]  Date of birth: 1971-02-17 Chief Complaint:   Gynecologic Exam  History of Present Illness:   Lauren Sampson is a 53 y.o. G40P1001 African-American female being seen today for a routine annual exam.  Current complaints: none, recently saw PCP and had physical exam including breast exam, needs Pap collected today. Mammogram completed 12/2023  Patient's last menstrual period was 04/19/2024 (approximate).   Upstream - 05/11/24 1200       Pregnancy Intention Screening   Does the patient want to become pregnant in the next year? No    Does the patient's partner want to become pregnant in the next year? N/A    Would the patient like to discuss contraceptive options today? No      Contraception Wrap Up   Current Method Abstinence    End Method Abstinence    Contraception Counseling Provided No    How was the end contraceptive method provided? N/A            The pregnancy intention screening data noted above was reviewed. Potential methods of contraception were discussed. The patient elected to proceed with Abstinence.      Component Value Date/Time   DIAGPAP (A) 07/18/2021 0930    - High grade squamous intraepithelial lesion (HSIL)   DIAGPAP (A) 05/14/2020 1102    - Atypical squamous cells of undetermined significance (ASC-US )   DIAGPAP - See comment (A) 05/14/2020 1102   HPVHIGH Negative 07/18/2021 0930   HPVHIGH Negative 05/14/2020 1102   ADEQPAP  07/18/2021 0930    Satisfactory for evaluation; transformation zone component PRESENT.   ADEQPAP  05/14/2020 1102    Satisfactory for evaluation; transformation zone component PRESENT.       Last pap 2022. Results were: NILM w/ HRHPV negative. H/O abnormal pap: yes Last mammogram: 12/2023. Results were: normal. Family h/o breast cancer: no Last colonoscopy: 03/2022. Results were: normal. Family h/o colorectal cancer: no     04/22/2024   10:49 AM 11/21/2022     8:03 AM 07/18/2021    9:39 AM 05/14/2020   10:41 AM 05/24/2018    8:15 AM  Depression screen PHQ 2/9  Decreased Interest 0 0 0 0 0  Down, Depressed, Hopeless 1 0 0 0 0  PHQ - 2 Score 1 0 0 0 0  Altered sleeping 0 0 0 0   Tired, decreased energy 0 0 0 0   Change in appetite 0 0 0 0   Feeling bad or failure about yourself  0 0 0 0   Trouble concentrating 0 0 0 0   Moving slowly or fidgety/restless 0 0 0 0   Suicidal thoughts 0 0 0 0   PHQ-9 Score 1 0 0 0   Difficult doing work/chores Not difficult at all Not difficult at all Not difficult at all Not difficult at all         04/22/2024   11:16 AM 11/21/2022    8:03 AM 07/18/2021    9:39 AM 05/14/2020   10:41 AM  GAD 7 : Generalized Anxiety Score  Nervous, Anxious, on Edge 0 0 0 0  Control/stop worrying 0 0 0 0  Worry too much - different things 0 0 0 0  Trouble relaxing 0 0 0 0  Restless 0 0 0 0  Easily annoyed or irritable 1 0 0 0  Afraid - awful might happen 0 0 0 0  Total GAD 7 Score 1 0 0  0  Anxiety Difficulty Not difficult at all Not difficult at all Not difficult at all Not difficult at all   Past Medical History:  Diagnosis Date   Lupus 2007   Family History  Problem Relation Age of Onset   Pancreatic cancer Mother    Alcohol abuse Mother    Liver cancer Father    Alcohol abuse Father    Cancer Maternal Aunt    Diabetes Maternal Uncle    Lupus Brother    Breast cancer Neg Hx      Review of Systems:   Pertinent items are noted in HPI Denies any headaches, blurred vision, fatigue, shortness of breath, chest pain, abdominal pain, abnormal vaginal discharge/itching/odor/irritation, problems with periods, bowel movements, urination, or intercourse unless otherwise stated above. Pertinent History Reviewed:  Reviewed past medical,surgical, social and family history.  Reviewed problem list, medications and allergies. Physical Assessment:   Vitals:   05/11/24 0828  BP: 119/79  Pulse: 68  Weight: 124 lb 12.8 oz (56.6  kg)  Height: 5\' 1"  (1.549 m)  Body mass index is 23.58 kg/m.       Physical Exam Vitals reviewed.  Constitutional:      General: She is not in acute distress.    Appearance: Normal appearance.  HENT:     Head: Normocephalic.  Neck:     Thyroid : No thyroid  mass or thyromegaly.  Cardiovascular:     Rate and Rhythm: Normal rate and regular rhythm.     Heart sounds: Normal heart sounds.  Pulmonary:     Effort: Pulmonary effort is normal.     Breath sounds: Normal breath sounds.  Abdominal:     General: Abdomen is flat.     Palpations: Abdomen is soft.     Tenderness: There is no abdominal tenderness.  Genitourinary:    General: Normal vulva.     Tanner stage (genital): 5.     Vagina: Normal.     Cervix: Friability present.  Musculoskeletal:     Cervical back: Neck supple. No tenderness.  Skin:    General: Skin is warm and dry.  Neurological:     General: No focal deficit present.     Mental Status: She is alert and oriented to person, place, and time.  Psychiatric:        Mood and Affect: Mood normal.        Behavior: Behavior normal.      No results found for this or any previous visit (from the past 24 hours).  Assessment & Plan:  1. Well woman exam (Primary)  2. Cervical cancer screening - Cytology - PAP  3. Encounter for screening for human papillomavirus (HPV) - Cytology - PAP  4. History of loop electrical excision procedure (LEEP)  Pap today, breast exam deferred given recent exam with PCP.   Mammogram: 12/2024, or sooner if problems Colonoscopy: per GI, or sooner if problems  No orders of the defined types were placed in this encounter.   Meds: No orders of the defined types were placed in this encounter.   Follow-up: Return in 1 year (on 05/11/2025) for Annual exam.  Forestine Igo, CNM 05/11/2024 12:00 PM

## 2024-05-18 LAB — CYTOLOGY - PAP
Comment: NEGATIVE
Diagnosis: NEGATIVE
Diagnosis: REACTIVE
High risk HPV: NEGATIVE

## 2024-05-23 ENCOUNTER — Ambulatory Visit: Payer: Self-pay | Admitting: Certified Nurse Midwife

## 2025-01-03 ENCOUNTER — Telehealth: Payer: Self-pay

## 2025-01-03 DIAGNOSIS — Z1231 Encounter for screening mammogram for malignant neoplasm of breast: Secondary | ICD-10-CM

## 2025-01-03 NOTE — Telephone Encounter (Signed)
 Copied from CRM [id number]. Topic: Referral - Request for Referral >> Jan 03, 2025  3:14 PM Joesph B wrote: Did the patient discuss referral with their provider in the last year? Yes (If No - schedule appointment) (If Yes - send message)  Appointment offered? Yes  Type of order/referral and detailed reason for visit: mammogram  Preference of office, provider, location: [facility]   If referral order, have you been seen by this specialty before? Yes (If Yes, this issue or another issue? When? Where?  Can we respond through MyChart? Prefers a phone call - [phone]

## 2025-01-03 NOTE — Telephone Encounter (Signed)
 Order placed. Left voicemail message for patient to call [facility] to scheduled appointment.

## 2025-01-30 ENCOUNTER — Encounter

## 2025-04-25 ENCOUNTER — Encounter: Admitting: Student
